# Patient Record
Sex: Male | Born: 1955 | Race: Black or African American | Hispanic: No | Marital: Married | Smoking: Former smoker
Health system: Southern US, Community
[De-identification: ages and names within clinical notes are randomized; demographics above are authoritative.]

## PROBLEM LIST (undated history)

## (undated) DIAGNOSIS — I10 Essential (primary) hypertension: Secondary | ICD-10-CM

## (undated) HISTORY — DX: Essential (primary) hypertension: I10

---

## 1978-09-11 DIAGNOSIS — I10 Essential (primary) hypertension: Secondary | ICD-10-CM | POA: Insufficient documentation

## 2008-07-21 ENCOUNTER — Ambulatory Visit: Payer: Self-pay | Admitting: Family Medicine

## 2008-07-21 ENCOUNTER — Encounter (INDEPENDENT_AMBULATORY_CARE_PROVIDER_SITE_OTHER): Payer: Self-pay | Admitting: Nurse Practitioner

## 2008-07-21 DIAGNOSIS — J309 Allergic rhinitis, unspecified: Secondary | ICD-10-CM | POA: Insufficient documentation

## 2008-07-21 DIAGNOSIS — M25569 Pain in unspecified knee: Secondary | ICD-10-CM

## 2008-08-18 ENCOUNTER — Encounter (INDEPENDENT_AMBULATORY_CARE_PROVIDER_SITE_OTHER): Payer: Self-pay | Admitting: Nurse Practitioner

## 2008-09-14 ENCOUNTER — Ambulatory Visit: Payer: Self-pay | Admitting: Nurse Practitioner

## 2008-09-14 LAB — CONVERTED CEMR LAB
ALT: 29 units/L (ref 0–53)
Bilirubin Urine: NEGATIVE
CO2: 25 meq/L (ref 19–32)
Calcium: 9.3 mg/dL (ref 8.4–10.5)
Chloride: 104 meq/L (ref 96–112)
Cholesterol: 201 mg/dL — ABNORMAL HIGH (ref 0–200)
Eosinophils Relative: 2 % (ref 0–5)
Glucose, Bld: 106 mg/dL — ABNORMAL HIGH (ref 70–99)
HCT: 47.2 % (ref 39.0–52.0)
Hemoglobin: 15.7 g/dL (ref 13.0–17.0)
Ketones, urine, test strip: NEGATIVE
Lymphocytes Relative: 45 % (ref 12–46)
Lymphs Abs: 2.1 10*3/uL (ref 0.7–4.0)
Monocytes Absolute: 0.4 10*3/uL (ref 0.1–1.0)
Neutro Abs: 2 10*3/uL (ref 1.7–7.7)
PSA: 0.65 ng/mL (ref 0.10–4.00)
Platelets: 290 10*3/uL (ref 150–400)
Sodium: 139 meq/L (ref 135–145)
Specific Gravity, Urine: 1.015
Total Bilirubin: 0.6 mg/dL (ref 0.3–1.2)
Total Protein: 7.1 g/dL (ref 6.0–8.3)
Triglycerides: 91 mg/dL (ref <150)
VLDL: 18 mg/dL (ref 0–40)
WBC: 4.7 10*3/uL (ref 4.0–10.5)
pH: 7

## 2008-09-15 ENCOUNTER — Encounter (INDEPENDENT_AMBULATORY_CARE_PROVIDER_SITE_OTHER): Payer: Self-pay | Admitting: Nurse Practitioner

## 2008-09-17 ENCOUNTER — Telehealth (INDEPENDENT_AMBULATORY_CARE_PROVIDER_SITE_OTHER): Payer: Self-pay | Admitting: Nurse Practitioner

## 2008-09-24 ENCOUNTER — Ambulatory Visit: Payer: Self-pay | Admitting: Gastroenterology

## 2008-10-08 ENCOUNTER — Encounter (INDEPENDENT_AMBULATORY_CARE_PROVIDER_SITE_OTHER): Payer: Self-pay | Admitting: Nurse Practitioner

## 2008-10-08 ENCOUNTER — Ambulatory Visit: Payer: Self-pay | Admitting: Gastroenterology

## 2012-08-16 ENCOUNTER — Encounter: Payer: Self-pay | Admitting: Family Medicine

## 2012-08-16 ENCOUNTER — Ambulatory Visit (INDEPENDENT_AMBULATORY_CARE_PROVIDER_SITE_OTHER): Payer: BC Managed Care – PPO | Admitting: Family Medicine

## 2012-08-16 VITALS — BP 150/110 | HR 93 | Temp 98.0°F | Resp 16 | Ht 68.0 in | Wt 191.0 lb

## 2012-08-16 DIAGNOSIS — Z23 Encounter for immunization: Secondary | ICD-10-CM

## 2012-08-16 DIAGNOSIS — I10 Essential (primary) hypertension: Secondary | ICD-10-CM

## 2012-08-16 DIAGNOSIS — Z0000 Encounter for general adult medical examination without abnormal findings: Secondary | ICD-10-CM

## 2012-08-16 DIAGNOSIS — N400 Enlarged prostate without lower urinary tract symptoms: Secondary | ICD-10-CM

## 2012-08-16 DIAGNOSIS — L509 Urticaria, unspecified: Secondary | ICD-10-CM

## 2012-08-16 DIAGNOSIS — Z13220 Encounter for screening for lipoid disorders: Secondary | ICD-10-CM

## 2012-08-16 LAB — COMPREHENSIVE METABOLIC PANEL
Albumin: 4.2 g/dL (ref 3.5–5.2)
Alkaline Phosphatase: 71 U/L (ref 39–117)
Glucose, Bld: 226 mg/dL — ABNORMAL HIGH (ref 70–99)
Potassium: 4.1 mEq/L (ref 3.5–5.3)
Sodium: 137 mEq/L (ref 135–145)
Total Protein: 7.1 g/dL (ref 6.0–8.3)

## 2012-08-16 LAB — LIPID PANEL
LDL Cholesterol: 165 mg/dL — ABNORMAL HIGH (ref 0–99)
Triglycerides: 130 mg/dL (ref <150)

## 2012-08-16 LAB — POCT URINALYSIS DIPSTICK
Ketones, UA: NEGATIVE
Leukocytes, UA: NEGATIVE
Nitrite, UA: NEGATIVE
Protein, UA: 30
Urobilinogen, UA: 0.2
pH, UA: 5.5

## 2012-08-16 LAB — T3, FREE: T3, Free: 3.4 pg/mL (ref 2.3–4.2)

## 2012-08-16 LAB — PSA: PSA: 0.51 ng/mL (ref <=4.00)

## 2012-08-16 MED ORDER — VERAPAMIL HCL 240 MG (CO) PO TB24: 240.0000 mg | ORAL_TABLET | Freq: Every day | ORAL | Status: DC

## 2012-08-16 MED ORDER — HYDROCHLOROTHIAZIDE 25 MG PO TABS: 25.0000 mg | ORAL_TABLET | Freq: Every day | ORAL | Status: DC

## 2012-08-16 NOTE — Progress Notes (Signed)
Subjective:    Patient ID: Jack Brady, male    DOB: July 10, 1956, 56 y.o.   MRN: [id number]  HPI   This 56 y.o. AA male has HTN but has been out of medications for several weeks. He had   CPE last year at [place] facility (15 years of service in the [employer]). He is asymptomatic.   Pt is married and works at [employer]. He is a nonsmoker but does drink 2-3 beers on a   weekly basis. Exercise- walking mostly on the job; upper body strength training consists  of push-ups.    Review of Systems  Constitutional: Negative.   HENT: Negative.   Eyes: Negative.   Respiratory: Negative.   Cardiovascular: Negative.   Gastrointestinal: Negative.   Genitourinary: Negative.        Occasional nocturia  X 1; no symptoms of prostatism  Musculoskeletal: Negative.   Skin: Positive for rash.       Itchy red bumps on inner aspects of arms and around neck. Also has itching of lips and eyelids.  Hematological: Negative.   Psychiatric/Behavioral: Negative.        Objective:   Physical Exam  Nursing note and vitals reviewed. Constitutional: He is oriented to person, place, and time. He appears well-developed and well-nourished. No distress.  HENT:  Head: Normocephalic and atraumatic.  Right Ear: Hearing, tympanic membrane, external ear and ear canal normal.  Left Ear: Hearing, tympanic membrane, external ear and ear canal normal.  Nose: Nose normal. No nasal deformity or septal deviation.  Mouth/Throat: Uvula is midline and mucous membranes are normal. No oral lesions. Normal dentition. No dental caries. Posterior oropharyngeal erythema present. No oropharyngeal exudate.  Eyes: EOM and lids are normal. Pupils are equal, round, and reactive to light. Right conjunctiva is injected. Left conjunctiva is injected. No scleral icterus.       Muddy sclerae  Neck: Normal range of motion. Neck supple. No thyromegaly present.  Cardiovascular: Normal rate, regular rhythm, normal heart sounds and intact  distal pulses.  Exam reveals no gallop and no friction rub.   No murmur heard. Pulmonary/Chest: Effort normal and breath sounds normal. No respiratory distress.  Abdominal: Soft. Normal appearance and normal aorta. He exhibits no distension, no pulsatile midline mass and no mass. There is no hepatosplenomegaly. There is no tenderness. There is no guarding and no CVA tenderness. No hernia.  Genitourinary: Testes normal and penis normal. Rectal exam shows external hemorrhoid. Rectal exam shows no fissure, no mass, no tenderness and anal tone normal. Prostate is enlarged. Prostate is not tender. Cremasteric reflex is present. Right testis shows no mass, no swelling and no tenderness. Right testis is descended. Left testis shows no mass, no swelling and no tenderness. Left testis is descended. Uncircumcised. No penile erythema or penile tenderness. No discharge found.  Musculoskeletal: Normal range of motion. He exhibits no edema and no tenderness.  Lymphadenopathy:    He has no cervical adenopathy.       Right: No inguinal adenopathy present.       Left: No inguinal adenopathy present.  Neurological: He is alert and oriented to person, place, and time. He has normal reflexes. No cranial nerve deficit. He exhibits normal muscle tone. Coordination normal.  Skin: Skin is warm and dry. Rash noted. No erythema.       Red papules on flexor creases of arms (pt reports similar rash occurs around his neck)  Psychiatric: He has a normal mood and affect. His behavior is normal.  Judgment and thought content normal.     ECG: NSR; ST elevation c/w J-point elevation. No ectopy.    Results for orders placed in visit on 08/16/12  POCT URINALYSIS DIPSTICK      Component Value Range   Color, UA yellow     Clarity, UA clear     Glucose, UA 100     Bilirubin, UA neg     Ketones, UA neg     Spec Grav, UA >=1.030     Blood, UA trace     pH, UA 5.5     Protein, UA 30     Urobilinogen, UA 0.2     Nitrite, UA  neg     Leukocytes, UA Negative    IFOBT (OCCULT BLOOD)      Component Value Range   IFOBT Negative          Assessment & Plan:   1. Routine general medical examination at a health care facility  POCT urinalysis dipstick, IFOBT POC (occult bld, rslt in office), PSA  2. HTN, goal below 130/80  Continue current medications Check thyroid function.   3. Screening for hyperlipidemia  Lipid panel  4. Need for prophylactic vaccination with combined diphtheria-tetanus-pertussis (DTP) vaccine  Tdap vaccine greater than or equal to 7yo IM  5. BPH (benign prostatic hypertrophy)  Await PSA result; pt is relatively asymptomatic.  6. Localized hives  Try OTC antihistamine (Claritin, Cetirizine or Allegra)

## 2012-08-16 NOTE — Patient Instructions (Addendum)
Keeping you healthy  Get these tests  Blood pressure- Have your blood pressure checked once a year by your healthcare provider.  Normal blood pressure is 120/80  Weight- Have your body mass index (BMI) calculated to screen for obesity.  BMI is a measure of body fat based on height and weight. You can also calculate your own BMI at [email or web address].  Cholesterol- Have your cholesterol checked every year.  Diabetes- Have your blood sugar checked regularly if you have high blood pressure, high cholesterol, have a family history of diabetes or if you are overweight.  Screening for Colon Cancer- Colonoscopy starting at age 55.  Screening may begin sooner depending on your family history and other health conditions. Follow up colonoscopy as directed by your Gastroenterologist.  Screening for Prostate Cancer- Both blood work (PSA) and a rectal exam help screen for Prostate Cancer.  Screening begins at age 55 with African-American men and at age 18 with Caucasian men.  Screening may begin sooner depending on your family history.  Take these medicines  Aspirin- One aspirin daily can help prevent Heart disease and Stroke. Take a Baby Aspirin (81 mg tablet) everyday.  Flu shot- Every fall.  Tetanus- Every 10 years. You received a Tdap today; this is a one-time vaccine. Your next Tetanus is due in 10 years.  Zostavax- Once after the age of 78 to prevent Shingles.  Pneumonia shot- Once after the age of 55; if you are younger than 84, ask your healthcare provider if you need a Pneumonia shot.  Take these steps  Don't smoke- If you do smoke, talk to your doctor about quitting.  For tips on how to quit, go to www.smokefree.gov or call 1-800-QUIT-NOW.  Be physically active- Exercise 5 days a week for at least 30 minutes.  If you are not already physically active start slow and gradually work up to 30 minutes of moderate physical activity.  Examples of moderate activity include walking briskly,  mowing the yard, dancing, swimming, bicycling, etc.  Eat a healthy diet- Eat a variety of healthy food such as fruits, vegetables, low fat milk, low fat cheese, yogurt, lean meant, poultry, fish, beans, tofu, etc. For more information go to www.thenutritionsource.org  Drink alcohol in moderation- Limit alcohol intake to less than two drinks a day. Never drink and drive.  Dentist- Brush and floss twice daily; visit your dentist twice a year.  Depression- Your emotional health is as important as your physical health. If you're feeling down, or losing interest in things you would normally enjoy please talk to your healthcare provider.  Eye exam- Visit your eye doctor every year.  Safe sex- If you may be exposed to a sexually transmitted infection, use a condom.  Seat belts- Seat belts can save your life; always wear one.  Smoke/Carbon Monoxide detectors- These detectors need to be installed on the appropriate level of your home.  Replace batteries at least once a year.  Skin cancer- When out in the sun, cover up and use sunscreen 15 SPF or higher.  Violence- If anyone is threatening you, please tell your healthcare provider.  Living Will/ Health care power of attorney- Speak with your healthcare provider and family.    Hypertension As your heart beats, it forces blood through your arteries. This force is your blood pressure. If the pressure is too high, it is called hypertension (HTN) or high blood pressure. HTN is dangerous because you may have it and not know it. High blood pressure may mean that  your heart has to work harder to pump blood. Your arteries may be narrow or stiff. The extra work puts you at risk for heart disease, stroke, and other problems.  Blood pressure consists of two numbers, a higher number over a lower, 110/72, for example. It is stated as "110 over 72." The ideal is below 120 for the top number (systolic) and under 80 for the bottom (diastolic). Write down your blood  pressure today. You should pay close attention to your blood pressure if you have certain conditions such as:  Heart failure.  Prior heart attack.  Diabetes  Chronic kidney disease.  Prior stroke.  Multiple risk factors for heart disease. To see if you have HTN, your blood pressure should be measured while you are seated with your arm held at the level of the heart. It should be measured at least twice. A one-time elevated blood pressure reading (especially in the Emergency Department) does not mean that you need treatment. There may be conditions in which the blood pressure is different between your right and left arms. It is important to see your caregiver soon for a recheck. Most people have essential hypertension which means that there is not a specific cause. This type of high blood pressure may be lowered by changing lifestyle factors such as:  Stress.  Smoking.  Lack of exercise.  Excessive weight.  Drug/tobacco/alcohol use.  Eating less salt. Most people do not have symptoms from high blood pressure until it has caused damage to the body. Effective treatment can often prevent, delay or reduce that damage. TREATMENT  When a cause has been identified, treatment for high blood pressure is directed at the cause. There are a large number of medications to treat HTN. These fall into several categories, and your caregiver will help you select the medicines that are best for you. Medications may have side effects. You should review side effects with your caregiver. If your blood pressure stays high after you have made lifestyle changes or started on medicines,   Your medication(s) may need to be changed.  Other problems may need to be addressed.  Be certain you understand your prescriptions, and know how and when to take your medicine.  Be sure to follow up with your caregiver within the time frame advised (usually within two weeks) to have your blood pressure rechecked and to  review your medications.  If you are taking more than one medicine to lower your blood pressure, make sure you know how and at what times they should be taken. Taking two medicines at the same time can result in blood pressure that is too low. SEEK IMMEDIATE MEDICAL CARE IF:  You develop a severe headache, blurred or changing vision, or confusion.  You have unusual weakness or numbness, or a faint feeling.  You have severe chest or abdominal pain, vomiting, or breathing problems. MAKE SURE YOU:   Understand these instructions.  Will watch your condition.  Will get help right away if you are not doing well or get worse. Document Released: 08/28/2005 Document Revised: 11/20/2011 Document Reviewed: 04/17/2008 [facility] Patient Information 2013 [place], [place].   Hives Hives (urticaria) are itchy, red, swollen patches on the skin. They may change size, shape, and location quickly and repeatedly. Hives that occur deeper in the skin can cause swelling of the hands, feet, and face. Hives may be an allergic reaction to something you ate, touched, or put on your skin. Hives can also be a reaction to cold, heat, viral  infections, medication, insect bites, or emotional stress. Often the cause is hard to find. Hives can come and go for several days to several weeks. Hives are not contagious. HOME CARE INSTRUCTIONS   If the cause of the hives is known, avoid exposure to that source.   To relieve itching and rash:   Apply cold compresses to the skin or take cool water baths. Do not take hot baths or showers because the warmth will make the itching worse.   The best medicine for hives is an antihistamine. An antihistamine will not cure hives, but it will reduce their severity. You can use an antihistamine available over the counter. This medicine may make you sleepy. You should not drive while using this medicine.   Take or give an antihistamine as directed by your pharmacist or caregiver. You can  get over-the-counter Claritin, Cetirizine (Zyrtec) or Fexofenadine (Allegra); take 1 tablet every evening.  Other medications may be prescribed for itching. Use these as directed.   You should wear loose fitting clothing, including undergarments, as skin irritations may make hives worse.   Follow-up as directed by your caregiver.  SEEK MEDICAL CARE IF:   You still have considerable itching after taking the medication (prescribed or purchased over the counter).   Joint swelling or pain occurs.   You are not improving or are getting worse.  SEEK IMMEDIATE MEDICAL CARE IF:   You have a fever.   You notice any swelling of the lips, tongue, face or neck.   You have shortness of breath, difficulty breathing or swallowing, or tightness in the throat or chest.   Belly (abdominal) pain develops.   You are feeling very sick.   Your hives continue to spread.  These may be the first signs of a life-threatening allergic reaction. THIS IS AN EMERGENCY. Call 911 for medical help. Document Released: 10/05/2004 Document Revised: 08/17/2011 Document Reviewed: 09/22/2008 [facility] Patient Information 2012 [place], [place].

## 2012-08-20 ENCOUNTER — Encounter: Payer: Self-pay | Admitting: Family Medicine

## 2012-08-20 DIAGNOSIS — N400 Enlarged prostate without lower urinary tract symptoms: Secondary | ICD-10-CM | POA: Insufficient documentation

## 2012-08-20 NOTE — Progress Notes (Signed)
Quick Note:  Please call pt and advise that the following labs are abnormal... Your blood sugar is elevated as well as cholesterol, especially LDL ("bad") cholesterol. Your risk of heart disease based on your lipid numbers is average. Work on better nutrition and regular exercsie. The "Mediterranean Diet" is a great guide for healthier eating. When you return in January, we will recheck your blood sugar and do a test to see if you have Diabetes.  Prostate blood test and thyroid tests are normal. Kidney and liver tests are normal.  Copy to pt. ______

## 2012-08-26 ENCOUNTER — Encounter: Payer: Self-pay | Admitting: Radiology

## 2012-09-27 ENCOUNTER — Ambulatory Visit: Payer: BC Managed Care – PPO | Admitting: Family Medicine

## 2012-10-29 ENCOUNTER — Encounter: Payer: Self-pay | Admitting: Family Medicine

## 2013-10-29 ENCOUNTER — Ambulatory Visit (INDEPENDENT_AMBULATORY_CARE_PROVIDER_SITE_OTHER): Payer: BC Managed Care – PPO | Admitting: Family Medicine

## 2013-10-29 ENCOUNTER — Encounter: Payer: Self-pay | Admitting: Family Medicine

## 2013-10-29 VITALS — BP 142/102 | HR 87 | Temp 98.2°F | Resp 16 | Ht 68.0 in | Wt 181.0 lb

## 2013-10-29 DIAGNOSIS — Z8639 Personal history of other endocrine, nutritional and metabolic disease: Secondary | ICD-10-CM

## 2013-10-29 DIAGNOSIS — Z0000 Encounter for general adult medical examination without abnormal findings: Secondary | ICD-10-CM

## 2013-10-29 DIAGNOSIS — I10 Essential (primary) hypertension: Secondary | ICD-10-CM

## 2013-10-29 DIAGNOSIS — E119 Type 2 diabetes mellitus without complications: Secondary | ICD-10-CM | POA: Insufficient documentation

## 2013-10-29 DIAGNOSIS — Z125 Encounter for screening for malignant neoplasm of prostate: Secondary | ICD-10-CM

## 2013-10-29 DIAGNOSIS — Z862 Personal history of diseases of the blood and blood-forming organs and certain disorders involving the immune mechanism: Secondary | ICD-10-CM

## 2013-10-29 DIAGNOSIS — R350 Frequency of micturition: Secondary | ICD-10-CM

## 2013-10-29 DIAGNOSIS — IMO0001 Reserved for inherently not codable concepts without codable children: Secondary | ICD-10-CM

## 2013-10-29 DIAGNOSIS — J309 Allergic rhinitis, unspecified: Secondary | ICD-10-CM

## 2013-10-29 DIAGNOSIS — E1165 Type 2 diabetes mellitus with hyperglycemia: Secondary | ICD-10-CM

## 2013-10-29 LAB — POCT URINALYSIS DIPSTICK
BILIRUBIN UA: NEGATIVE
Blood, UA: NEGATIVE
Glucose, UA: 500
KETONES UA: NEGATIVE
Leukocytes, UA: NEGATIVE
Nitrite, UA: NEGATIVE
PH UA: 6
Protein, UA: 100
Spec Grav, UA: 1.025
Urobilinogen, UA: 0.2

## 2013-10-29 LAB — COMPLETE METABOLIC PANEL WITH GFR
ALT: 21 U/L (ref 0–53)
AST: 18 U/L (ref 0–37)
Albumin: 4 g/dL (ref 3.5–5.2)
Alkaline Phosphatase: 72 U/L (ref 39–117)
BILIRUBIN TOTAL: 0.6 mg/dL (ref 0.2–1.2)
BUN: 13 mg/dL (ref 6–23)
CHLORIDE: 97 meq/L (ref 96–112)
CO2: 28 mEq/L (ref 19–32)
Calcium: 9.4 mg/dL (ref 8.4–10.5)
Creat: 0.95 mg/dL (ref 0.50–1.35)
GFR, EST NON AFRICAN AMERICAN: 89 mL/min
GLUCOSE: 287 mg/dL — AB (ref 70–99)
Potassium: 4.3 mEq/L (ref 3.5–5.3)
SODIUM: 134 meq/L — AB (ref 135–145)
Total Protein: 7.1 g/dL (ref 6.0–8.3)

## 2013-10-29 LAB — POCT GLYCOSYLATED HEMOGLOBIN (HGB A1C): HEMOGLOBIN A1C: 14

## 2013-10-29 LAB — LIPID PANEL
Cholesterol: 245 mg/dL — ABNORMAL HIGH (ref 0–200)
HDL: 56 mg/dL (ref >39)
LDL CALC: 161 mg/dL — AB (ref 0–99)
TRIGLYCERIDES: 138 mg/dL (ref <150)
Total CHOL/HDL Ratio: 4.4 Ratio
VLDL: 28 mg/dL (ref 0–40)

## 2013-10-29 LAB — IFOBT (OCCULT BLOOD): IMMUNOLOGICAL FECAL OCCULT BLOOD TEST: NEGATIVE

## 2013-10-29 MED ORDER — VERAPAMIL HCL 240 MG (CO) PO TB24: 240.0000 mg | ORAL_TABLET | Freq: Every day | ORAL | Status: DC

## 2013-10-29 MED ORDER — METFORMIN HCL 1000 MG PO TABS: ORAL_TABLET | ORAL | Status: DC

## 2013-10-29 MED ORDER — GLUCOSE BLOOD VI STRP: ORAL_STRIP | Status: DC

## 2013-10-29 MED ORDER — BLOOD PRESSURE MONITOR/M CUFF MISC: 1.0000 | Freq: Every day | Status: DC

## 2013-10-29 MED ORDER — HYDROCHLOROTHIAZIDE 25 MG PO TABS: 25.0000 mg | ORAL_TABLET | Freq: Every day | ORAL | Status: DC

## 2013-10-29 MED ORDER — DESLORATADINE 5 MG PO TBDP: ORAL_TABLET | ORAL | Status: DC

## 2013-10-29 NOTE — Patient Instructions (Addendum)
ALLERGIES- I have prescribed a medication (Clarinex-generic); take 1 tablet daily, Get an over-the-counter saline nasal mist to help reduce congestion and allergy symptoms.   Keeping you healthy  Get these tests  Blood pressure- Have your blood pressure checked once a year by your healthcare provider.  Normal blood pressure is 120/80  Weight- Have your body mass index (BMI) calculated to screen for obesity.  BMI is a measure of body fat based on height and weight. You can also calculate your own BMI at [email or web address].  Cholesterol- Have your cholesterol checked every year.  Diabetes- Have your blood sugar checked regularly if you have high blood pressure, high cholesterol, have a family history of diabetes or if you are overweight.  Screening for Colon Cancer- Colonoscopy starting at age 35.  Screening may begin sooner depending on your family history and other health conditions. Follow up colonoscopy as directed by your Gastroenterologist.  Screening for Prostate Cancer- Both blood work (PSA) and a rectal exam help screen for Prostate Cancer.  Screening begins at age 67 with African-American men and at age 61 with Caucasian men.  Screening may begin sooner depending on your family history.  Take these medicines  Aspirin- One aspirin daily can help prevent Heart disease and Stroke.  Flu shot- Every fall.  Tetanus- Every 10 years. You received a Tdap in Dec 2013; next Tetanus due in 2023.  Zostavax- Once after the age of 70 to prevent Shingles.  Pneumonia shot- Once after the age of 38; if you are younger than 54, ask your healthcare provider if you need a Pneumonia shot.  Take these steps  Don't smoke- If you do smoke, talk to your doctor about quitting.  For tips on how to quit, go to www.smokefree.gov or call 1-800-QUIT-NOW.  Be physically active- Exercise 5 days a week for at least 30 minutes.  If you are not already physically active start slow and gradually work up  to 30 minutes of moderate physical activity.  Examples of moderate activity include walking briskly, mowing the yard, dancing, swimming, bicycling, etc.  Eat a healthy diet- Eat a variety of healthy food such as fruits, vegetables, low fat milk, low fat cheese, yogurt, lean meant, poultry, fish, beans, tofu, etc. For more information go to www.thenutritionsource.org  Drink alcohol in moderation- Limit alcohol intake to less than two drinks a day. Never drink and drive.  Dentist- Brush and floss twice daily; visit your dentist twice a year.  Depression- Your emotional health is as important as your physical health. If you're feeling down, or losing interest in things you would normally enjoy please talk to your healthcare provider.  Eye exam- Visit your eye doctor every year.  Safe sex- If you may be exposed to a sexually transmitted infection, use a condom.  Seat belts- Seat belts can save your life; always wear one.  Smoke/Carbon Monoxide detectors- These detectors need to be installed on the appropriate level of your home.  Replace batteries at least once a year.  Skin cancer- When out in the sun, cover up and use sunscreen 15 SPF or higher.  Violence- If anyone is threatening you, please tell your healthcare provider.  Living Will/ Health care power of attorney- Speak with your healthcare provider and family.   Diabetes and Standards of Medical Care  Diabetes is complicated. You may find that your diabetes team includes a dietitian, nurse, diabetes educator, eye doctor, and more. To help everyone know what is going on and to help you  get the care you deserve, the following schedule of care was developed to help keep you on track. Below are the tests, exams, vaccines, medicines, education, and plans you will need. HbA1c test This test shows how well you have controlled your glucose over the past 2 3 months. It is used to see if your diabetes management plan needs to be adjusted.   It  is performed at least 2 times a year if you are meeting treatment goals.  It is performed 4 times a year if therapy has changed or if you are not meeting treatment goals. Blood pressure test  This test is performed at every routine medical visit. The goal is less than 140/90 mmHg for most people, but 130/80 mmHg in some cases. Ask your health care provider about your goal. Dental exam  Follow up with the dentist regularly. Eye exam  If you are diagnosed with type 1 diabetes as a child, get an exam upon reaching the age of 3 years or older and have had diabetes for 3 5 years. Yearly eye exams are recommended after that initial eye exam.  If you are diagnosed with type 1 diabetes as an adult, get an exam within 5 years of diagnosis and then yearly.  If you are diagnosed with type 2 diabetes, get an exam as soon as possible after the diagnosis and then yearly. Foot care exam  Visual foot exams are performed at every routine medical visit. The exams check for cuts, injuries, or other problems with the feet.  A comprehensive foot exam should be done yearly. This includes visual inspection as well as assessing foot pulses and testing for loss of sensation.  Check your feet nightly for cuts, injuries, or other problems with your feet. Tell your health care provider if anything is not healing. Kidney function test (urine microalbumin)  This test is performed once a year.  Type 1 diabetes: The first test is performed 5 years after diagnosis.  Type 2 diabetes: The first test is performed at the time of diagnosis.  A serum creatinine and estimated glomerular filtration rate (eGFR) test is done once a year to assess the level of chronic kidney disease (CKD), if present. Lipid profile (cholesterol, HDL, LDL, triglycerides)  Performed every 5 years for most people.  The goal for LDL is less than 100 mg/dL. If you are at high risk, the goal is less than 70 mg/dL.  The goal for HDL is 40 mg/dL  50 mg/dL for men and 50 mg/dL 60 mg/dL for women. An HDL cholesterol of 60 mg/dL or higher gives some protection against heart disease.  The goal for triglycerides is less than 150 mg/dL. Influenza vaccine, pneumococcal vaccine, and hepatitis B vaccine  The influenza vaccine is recommended yearly.  The pneumococcal vaccine is generally given once in a lifetime. However, there are some instances when another vaccination is recommended. Check with your health care provider.  The hepatitis B vaccine is also recommended for adults with diabetes. Diabetes self-management education  Education is recommended at diagnosis and ongoing as needed. Treatment plan  Your treatment plan is reviewed at every medical visit. Document Released: 06/25/2009 Document Revised: 04/30/2013 Document Reviewed: 01/28/2013 [facility] Patient Information 2014 [place].   Diabetes Meal Planning Guide The diabetes meal planning guide is a tool to help you plan your meals and snacks. It is important for people with diabetes to manage their blood glucose (sugar) levels. Choosing the right foods and the right amounts throughout your day  will help control your blood glucose. Eating right can even help you improve your blood pressure and reach or maintain a healthy weight. CARBOHYDRATE COUNTING MADE EASY When you eat carbohydrates, they turn to sugar. This raises your blood glucose level. Counting carbohydrates can help you control this level so you feel better. When you plan your meals by counting carbohydrates, you can have more flexibility in what you eat and balance your medicine with your food intake. Carbohydrate counting simply means adding up the total amount of carbohydrate grams in your meals and snacks. Try to eat about the same amount at each meal. Foods with carbohydrates are listed below. Each portion below is 1 carbohydrate serving or 15 grams of carbohydrates. Ask your dietician how many grams of  carbohydrates you should eat at each meal or snack. Grains and Starches  1 slice bread.   English muffin or hotdog/hamburger bun.   cup cold cereal (unsweetened).   cup cooked pasta or rice.   cup starchy vegetables (corn, potatoes, peas, beans, winter squash).  1 tortilla (6 inches).   bagel.  1 waffle or pancake (size of a CD).   cup cooked cereal.  4 to 6 small crackers. *Whole grain is recommended. Fruit  1 cup fresh unsweetened berries, melon, papaya, pineapple.  1 small fresh fruit.   banana or mango.   cup fruit juice (4 oz unsweetened).   cup canned fruit in natural juice or water.  2 tbs dried fruit.  12 to 15 grapes or cherries. Milk and Yogurt  1 cup fat-free or 1% milk.  1 cup soy milk.  6 oz light yogurt with sugar-free sweetener.  6 oz low-fat soy yogurt.  6 oz plain yogurt. Vegetables  1 cup raw or  cup cooked is counted as 0 carbohydrates or a "free" food.  If you eat 3 or more servings at 1 meal, count them as 1 carbohydrate serving. Other Carbohydrates   oz chips or pretzels.   cup ice cream or frozen yogurt.   cup sherbet or sorbet.  2 inch square cake, no frosting.  1 tbs honey, sugar, jam, jelly, or syrup.  2 small cookies.  3 squares of graham crackers.  3 cups popcorn.  6 crackers.  1 cup broth-based soup.  Count 1 cup casserole or other mixed foods as 2 carbohydrate servings.  Foods with less than 20 calories in a serving may be counted as 0 carbohydrates or a "free" food. You may want to purchase a book or computer software that lists the carbohydrate gram counts of different foods. In addition, the nutrition facts panel on the labels of the foods you eat are a good source of this information. The label will tell you how big the serving size is and the total number of carbohydrate grams you will be eating per serving. Divide this number by 15 to obtain the number of carbohydrate servings in a portion.  Remember, 1 carbohydrate serving equals 15 grams of carbohydrate. SERVING SIZES Measuring foods and serving sizes helps you make sure you are getting the right amount of food. The list below tells how big or small some common serving sizes are.  1 oz.........4 stacked dice.  3 oz........Marland KitchenDeck of cards.  1 tsp.......Marland KitchenTip of little finger.  1 tbs......Marland KitchenMarland KitchenThumb.  2 tbs.......Marland KitchenGolf ball.   cup......Marland KitchenHalf of a fist.  1 cup.......Marland KitchenA fist. SAMPLE DIABETES MEAL PLAN Below is a sample meal plan that includes foods from the grain and starches, dairy, vegetable, fruit, and meat groups.  A dietician can individualize a meal plan to fit your calorie needs and tell you the number of servings needed from each food group. However, controlling the total amount of carbohydrates in your meal or snack is more important than making sure you include all of the food groups at every meal. You may interchange carbohydrate containing foods (dairy, starches, and fruits). The meal plan below is an example of a 2000 calorie diet using carbohydrate counting. This meal plan has 17 carbohydrate servings. Breakfast  1 cup oatmeal (2 carb servings).   cup light yogurt (1 carb serving).  1 cup blueberries (1 carb serving).   cup almonds. Snack  1 large apple (2 carb servings).  1 low-fat string cheese stick. Lunch  Chicken breast salad.  1 cup spinach.   cup chopped tomatoes.  2 oz chicken breast, sliced.  2 tbs low-fat [place] dressing.  12 whole-wheat crackers (2 carb servings).  12 to 15 grapes (1 carb serving).  1 cup low-fat milk (1 carb serving). Snack  1 cup carrots.   cup hummus (1 carb serving). Dinner  3 oz broiled salmon.  1 cup brown rice (3 carb servings). Snack  1  cups steamed broccoli (1 carb serving) drizzled with 1 tsp olive oil and lemon juice.  1 cup light pudding (2 carb servings). DIABETES MEAL PLANNING WORKSHEET Your dietician can use this worksheet to help  you decide how many servings of foods and what types of foods are right for you.  BREAKFAST Food Group and Servings / Carb Servings Grain/Starches __________________________________ Dairy __________________________________________ Vegetable ______________________________________ Fruit ___________________________________________ Meat __________________________________________ Fat ____________________________________________ LUNCH Food Group and Servings / Carb Servings Grain/Starches ___________________________________ Dairy ___________________________________________ Fruit ____________________________________________ Meat ___________________________________________ Fat _____________________________________________ [employer] Food Group and Servings / Carb Servings Grain/Starches ___________________________________ Dairy ___________________________________________ Fruit ____________________________________________ Meat ___________________________________________ Fat _____________________________________________ SNACKS Food Group and Servings / Carb Servings Grain/Starches ___________________________________ Dairy ___________________________________________ Vegetable _______________________________________ Fruit ____________________________________________ Meat ___________________________________________ Fat _____________________________________________ DAILY TOTALS Starches _________________________ Vegetable ________________________ Fruit ____________________________ Dairy ____________________________ Meat ____________________________ Fat ______________________________ Document Released: 05/25/2005 Document Revised: 11/20/2011 Document Reviewed: 04/05/2009 [employer] Patient Information 2014 [employer].

## 2013-10-29 NOTE — Progress Notes (Signed)
Subjective:    Patient ID: Jack StampsRaymond Crall, male    DOB: [birth year], 57 y.o.   MRN: [id number]  HPI  This 58 y.o. AA male has HTN; out of his meds for months. He c/o urinary frequency and frothy urine. Diabetes runs in his family; his brother died as a result of diabetic coma. He has scheduled a vision exam.  Patient Active Problem List   Diagnosis Date Noted  . BPH (benign prostatic hypertrophy) 08/20/2012  . ALLERGIC RHINITIS 07/21/2008  . KNEE PAIN, RIGHT 07/21/2008  . HYPERTENSION, BENIGN ESSENTIAL 09/11/1978    Prior to Admission medications   Medication Sig Start Date End Date Taking? Authorizing Provider  aspirin 81 MG tablet Take 81 mg by mouth daily.   Yes Historical Provider, MD  fish oil-omega-3 fatty acids 1000 MG capsule Take 2 g by mouth daily.   Yes Historical Provider, MD  hydrochlorothiazide (HYDRODIURIL) 25 MG tablet Take 1 tablet (25 mg total) by mouth daily. 10/29/13  Yes Maurice MarchBarbara B Florrie Ramires, MD  OVER THE COUNTER MEDICATION Vitamin D 400 iu daily   Yes Historical Provider, MD  verapamil (COVERA HS) 240 MG (CO) 24 hr tablet Take 1 tablet (240 mg total) by mouth at bedtime. 10/29/13  Yes Maurice MarchBarbara B Adreana Coull, MD  desloratadine (CLARINEX REDITAB) 5 MG disintegrating tablet Take 1 tablet by mouth once a day. 10/29/13   Maurice MarchBarbara B Donni Oglesby, MD   PMHx, Surg Hx, Soc and Fam Hx reviewed.  Review of Systems  Constitutional: Positive for unexpected weight change.       Weight loss= 10 lbs.  HENT: Positive for congestion, ear pain, postnasal drip and sinus pressure. Negative for sore throat and trouble swallowing.   Eyes: Positive for redness and itching.  Respiratory: Negative.   Cardiovascular: Negative.   Gastrointestinal: Negative.   Endocrine: Positive for polyuria.  Genitourinary: Positive for discharge and penile swelling.       Pt is monogamous (married); not currently having sex.  Musculoskeletal: Negative.   Skin: Negative.   Allergic/Immunologic: Positive for  environmental allergies.  Neurological: Negative.   Hematological: Negative.   Psychiatric/Behavioral: Negative.       Objective:   Physical Exam  Nursing note and vitals reviewed. Constitutional: He is oriented to person, place, and time. He appears well-developed and well-nourished. No distress.  HENT:  Head: Normocephalic and atraumatic.  Right Ear: Hearing, external ear and ear canal normal. Tympanic membrane is injected and erythematous. Tympanic membrane is not perforated and not bulging. No middle ear effusion. No decreased hearing is noted.  Left Ear: Tympanic membrane, external ear and ear canal normal. No decreased hearing is noted.  Nose: Mucosal edema present. No rhinorrhea, nasal deformity or septal deviation.  Mouth/Throat: Uvula is midline and mucous membranes are normal. No oral lesions. Abnormal dentition. Dental caries present. Posterior oropharyngeal erythema present. No oropharyngeal exudate.  Eyes: EOM and lids are normal. Pupils are equal, round, and reactive to light. Right conjunctiva is injected. Left conjunctiva is injected. No scleral icterus.  Fundoscopic exam:      The right eye shows arteriolar narrowing. The right eye shows no papilledema. The right eye shows red reflex.       The left eye shows arteriolar narrowing. The left eye shows no papilledema. The left eye shows red reflex.  Neck: Trachea normal, normal range of motion and full passive range of motion without pain. Neck supple. No JVD present. No spinous process tenderness and no muscular tenderness present. No mass and no  thyromegaly present.  Cardiovascular: Normal rate, regular rhythm, S1 normal, S2 normal, normal heart sounds and normal pulses.   No extrasystoles are present. PMI is not displaced.  Exam reveals no gallop and no friction rub.   No murmur heard. Pulmonary/Chest: Effort normal and breath sounds normal. No respiratory distress.  Abdominal: Soft. Normal appearance and bowel sounds are  normal. He exhibits no distension, no abdominal bruit, no pulsatile midline mass and no mass. There is no hepatosplenomegaly. There is no tenderness. There is no guarding and no CVA tenderness. No hernia.  Genitourinary: Rectal exam shows external hemorrhoid. Rectal exam shows no fissure, no mass, no tenderness and anal tone normal. Guaiac negative stool. Prostate is enlarged. Prostate is not tender.  Musculoskeletal: Normal range of motion. He exhibits no edema and no tenderness.  Lymphadenopathy:       Head (right side): No submental, no submandibular, no tonsillar, no preauricular, no posterior auricular and no occipital adenopathy present.       Head (left side): No submental, no submandibular, no tonsillar, no preauricular, no posterior auricular and no occipital adenopathy present.    He has no cervical adenopathy.       Right: No inguinal and no supraclavicular adenopathy present.       Left: No inguinal and no supraclavicular adenopathy present.  Neurological: He is alert and oriented to person, place, and time. He has normal strength and normal reflexes. He displays no atrophy and no tremor. No cranial nerve deficit or sensory deficit. He exhibits normal muscle tone. Coordination and gait normal.  Skin: Skin is warm, dry and intact. No ecchymosis, no lesion and no rash noted. He is not diaphoretic. No cyanosis or erythema. Nails show no clubbing.  Scalp- R temporal area (above ear)- 1 cm hyperpigmented flat nevus.  Psychiatric: He has a normal mood and affect. His speech is normal and behavior is normal. Judgment and thought content normal. Cognition and memory are normal.    Results for orders placed in visit on 10/29/13  POCT URINALYSIS DIPSTICK      Result Value Ref Range   Color, UA yellow     Clarity, UA clear     Glucose, UA 500     Bilirubin, UA neg     Ketones, UA neg     Spec Grav, UA 1.025     Blood, UA neg     pH, UA 6.0     Protein, UA 100     Urobilinogen, UA 0.2      Nitrite, UA neg     Leukocytes, UA Negative    POCT GLYCOSYLATED HEMOGLOBIN (HGB A1C)      Result Value Ref Range   Hemoglobin A1C 14.0    IFOBT (OCCULT BLOOD)      Result Value Ref Range   IFOBT Negative        Assessment & Plan:  Routine general medical examination at a health care facility  HYPERTENSION, BENIGN ESSENTIAL -Medication compliance emphasized. Continue same medications.     Plan: Lipid panel, COMPLETE METABOLIC PANEL WITH GFR  Hx of hyperglycemia - Dates back to 2011.  Plan: POCT glycosylated hemoglobin (Hb A1C)  Type II or unspecified type diabetes mellitus without mention of complication, uncontrolled - Metformin 1000 mg 1 tablet once daily for 2 weeks then increase to 1 tab bid w/ meals. Discussed common side effects. Pt apprehensive about Insulin; advised him that Insulin will be prescribed if Diabetic control not achieved on oral medication. Plan: For home  use only DME Glucometer; FSBS twice daily.  Will need urine micro-albumin at follow-up DM visit.  Urinary frequency - Due to glucosuria.   Plan: POCT urinalysis dipstick, POCT glycosylated hemoglobin (Hb A1C)  Allergic rhinitis- Clarinex prescribed; get OTC saline nasal spray- AYR- and use per package directions.  Screening for prostate cancer - Plan: IFOBT POC (occult bld, rslt in office), PSA   Meds ordered this encounter  Medications  . DISCONTD: hydrochlorothiazide (HYDRODIURIL) 25 MG tablet    Sig: Take 1 tablet (25 mg total) by mouth daily.    Dispense:  30 tablet    Refill:  5  . DISCONTD: verapamil (COVERA HS) 240 MG (CO) 24 hr tablet    Sig: Take 1 tablet (240 mg total) by mouth at bedtime.    Dispense:  30 tablet    Refill:  5  . hydrochlorothiazide (HYDRODIURIL) 25 MG tablet    Sig: Take 1 tablet (25 mg total) by mouth daily.    Dispense:  30 tablet    Refill:  5  . verapamil (COVERA HS) 240 MG (CO) 24 hr tablet    Sig: Take 1 tablet (240 mg total) by mouth at bedtime.    Dispense:  30  tablet    Refill:  5  . desloratadine (CLARINEX REDITAB) 5 MG disintegrating tablet    Sig: Take 1 tablet by mouth once a day.    Dispense:  30 tablet    Refill:  5  . Blood Pressure Monitoring (BLOOD PRESSURE MONITOR/M CUFF) MISC    Sig: 1 Device by Does not apply route daily.    Dispense:  1 each    Refill:  0    Wrist cuff- medium  . metFORMIN (GLUCOPHAGE) 1000 MG tablet    Sig: Take 1 tablet daily with largest meal for 2 weeks; increase dose to 1 tablet twice a day with meals.    Dispense:  60 tablet    Refill:  3  . glucose blood test strip    Sig: Use as instructed; test blood sugar twice a day.    Dispense:  60 each    Refill:  12

## 2013-10-30 LAB — PSA: PSA: 0.55 ng/mL (ref <=4.00)

## 2013-10-30 LAB — GC/CHLAMYDIA PROBE AMP
CT Probe RNA: NEGATIVE
GC Probe RNA: NEGATIVE

## 2013-10-31 NOTE — Progress Notes (Signed)
Quick Note:  Please advise pt regarding following labs... Total cholesterol and LDL ("bad") cholesterol are above normal. This is consistent with Diabetes; these numbers should improve once Diabetes is better controlled.  All other labs results are normal. Keep next appointment in April for Diabetes follow-up.  Copy to pt. ______

## 2013-12-10 ENCOUNTER — Encounter: Payer: Self-pay | Admitting: Family Medicine

## 2013-12-10 ENCOUNTER — Ambulatory Visit (INDEPENDENT_AMBULATORY_CARE_PROVIDER_SITE_OTHER): Payer: BC Managed Care – PPO | Admitting: Family Medicine

## 2013-12-10 VITALS — BP 137/88 | HR 73 | Temp 98.4°F | Resp 16 | Ht 68.5 in | Wt 184.0 lb

## 2013-12-10 DIAGNOSIS — E1165 Type 2 diabetes mellitus with hyperglycemia: Principal | ICD-10-CM

## 2013-12-10 DIAGNOSIS — IMO0001 Reserved for inherently not codable concepts without codable children: Secondary | ICD-10-CM

## 2013-12-10 LAB — POCT GLYCOSYLATED HEMOGLOBIN (HGB A1C): Hemoglobin A1C: 10.4

## 2013-12-10 NOTE — Progress Notes (Signed)
S:  This 58 y.o. AA male has Type II DM, uncontrolled. FSBS 2x daily; this morning was 127. Denies hypoglycemic episodes. Compliant w/ medications. No GI side effects w/ Metformin. No reports of fatigue, diaphoresis, abnormal weight change, vision disturbance, CP or tightness, edema, HA, dizziness, weakness or syncope.  Vision exam scheduled for December 15, 2013.  Patient Active Problem List   Diagnosis Date Noted  . Type II or unspecified type diabetes mellitus without mention of complication, uncontrolled 10/29/2013  . BPH (benign prostatic hypertrophy) 08/20/2012  . ALLERGIC RHINITIS 07/21/2008  . KNEE PAIN, RIGHT 07/21/2008  . HYPERTENSION, BENIGN ESSENTIAL 09/11/1978   Prior to Admission medications   Medication Sig Start Date End Date Taking? Authorizing Provider  aspirin 81 MG tablet Take 81 mg by mouth daily.   Yes Historical Provider, MD  desloratadine (CLARINEX REDITAB) 5 MG disintegrating tablet Take 1 tablet by mouth once a day. 10/29/13  Yes Maurice MarchBarbara B Hulen Mandler, MD  fish oil-omega-3 fatty acids 1000 MG capsule Take 2 g by mouth daily.   Yes Historical Provider, MD  glucose blood test strip Use as instructed; test blood sugar twice a day. 10/29/13  Yes Maurice MarchBarbara B Keela Rubert, MD  hydrochlorothiazide (HYDRODIURIL) 25 MG tablet Take 1 tablet (25 mg total) by mouth daily. 10/29/13  Yes Maurice MarchBarbara B Yoshiharu Brassell, MD  metFORMIN (GLUCOPHAGE) 1000 MG tablet Take 1 tablet daily with largest meal for 2 weeks; increase dose to 1 tablet twice a day with meals. 10/29/13  Yes Maurice MarchBarbara B Malcomb Gangemi, MD  OVER THE COUNTER MEDICATION Vitamin D 400 iu daily   Yes Historical Provider, MD  verapamil (COVERA HS) 240 MG (CO) 24 hr tablet Take 1 tablet (240 mg total) by mouth at bedtime. 10/29/13  Yes Maurice MarchBarbara B Kiylee Thoreson, MD   PMHx, Surg, Hx, Soc and Fam Hx reviewed.  O: Filed Vitals:   12/10/13 1002  BP: 137/88  Pulse: 73  Temp: 98.4 F (36.9 C)  Resp: 16   GEN: In NAD; WN,WD. HENT: [place]/AT. EOMI w/ clear  conj/sclerae. Wears corrective lenses. Otherwise unremarkable. COR: RRR. LUNGS: Unlabored resp/ SKIN: W&D; Intact w/o diaphoresis, erythema or lesions. NEURO: A&O x 3; CNs intact. Nonfocal.  Results for orders placed in visit on 12/10/13  POCT GLYCOSYLATED HEMOGLOBIN (HGB A1C)      Result Value Ref Range   Hemoglobin A1C 10.4     A/P: Type II or unspecified type diabetes mellitus without mention of complication, uncontrolled - Continue current medications, meal planning and physical activity. Plan: HM Diabetes Foot Exam, POCT glycosylated hemoglobin (Hb A1C)  RTC in 3 months.

## 2014-03-11 ENCOUNTER — Ambulatory Visit: Payer: BC Managed Care – PPO | Admitting: Family Medicine

## 2014-05-05 ENCOUNTER — Encounter: Payer: Self-pay | Admitting: Family Medicine

## 2014-05-05 ENCOUNTER — Ambulatory Visit (INDEPENDENT_AMBULATORY_CARE_PROVIDER_SITE_OTHER): Payer: BC Managed Care – PPO | Admitting: Family Medicine

## 2014-05-05 ENCOUNTER — Ambulatory Visit (INDEPENDENT_AMBULATORY_CARE_PROVIDER_SITE_OTHER): Payer: BC Managed Care – PPO

## 2014-05-05 VITALS — BP 142/96 | HR 80 | Temp 98.1°F | Resp 16 | Ht 68.0 in | Wt 184.8 lb

## 2014-05-05 DIAGNOSIS — E1165 Type 2 diabetes mellitus with hyperglycemia: Principal | ICD-10-CM

## 2014-05-05 DIAGNOSIS — M79641 Pain in right hand: Secondary | ICD-10-CM

## 2014-05-05 DIAGNOSIS — M79609 Pain in unspecified limb: Secondary | ICD-10-CM

## 2014-05-05 DIAGNOSIS — I10 Essential (primary) hypertension: Secondary | ICD-10-CM

## 2014-05-05 DIAGNOSIS — IMO0001 Reserved for inherently not codable concepts without codable children: Secondary | ICD-10-CM

## 2014-05-05 LAB — POCT GLYCOSYLATED HEMOGLOBIN (HGB A1C): Hemoglobin A1C: 7.4

## 2014-05-05 MED ORDER — MELOXICAM 7.5 MG PO TABS: 7.5000 mg | ORAL_TABLET | Freq: Every day | ORAL | Status: DC

## 2014-05-05 NOTE — Progress Notes (Signed)
S:  This 58 y.o. AA male is here for DM follow-up; last A1c= 10.4. Much improved today. No FSBS documentation; no hypoglycemia reported. Pt had modified meal plan and is exercising daily. No GI upset w/ Metformin.  HTN- medication compliance is good; no reported fatigue, vision disturbances, CP or tightness, palpitations, SOB or DOE, edema, HA, dizziness, numbness, weakness or syncope.  R hand pain- R hand dominant. Onset within last 2-3 days. Weakness when trying to lift or grasp objects. No known trauma. No hx of previous injury/ fracture, gout, numbness or paresthesia or swelling. Took 1 regular strength ASA w/ temporary relief.  Patient Active Problem List   Diagnosis Date Noted  . Type II or unspecified type diabetes mellitus without mention of complication, uncontrolled 10/29/2013  . BPH (benign prostatic hypertrophy) 08/20/2012  . ALLERGIC RHINITIS 07/21/2008  . KNEE PAIN, RIGHT 07/21/2008  . HYPERTENSION, BENIGN ESSENTIAL 09/11/1978    Prior to Admission medications   Medication Sig Start Date End Date Taking? Authorizing Provider  aspirin 81 MG tablet Take 81 mg by mouth daily.   Yes Historical Provider, MD  desloratadine (CLARINEX REDITAB) 5 MG disintegrating tablet Take 1 tablet by mouth once a day. 10/29/13  Yes Maurice March, MD  fish oil-omega-3 fatty acids 1000 MG capsule Take 2 g by mouth daily.   Yes Historical Provider, MD  glucose blood test strip Use as instructed; test blood sugar twice a day. 10/29/13  Yes Maurice March, MD  hydrochlorothiazide (HYDRODIURIL) 25 MG tablet Take 1 tablet (25 mg total) by mouth daily. 10/29/13  Yes Maurice March, MD  metFORMIN (GLUCOPHAGE) 1000 MG tablet Take 1 tablet daily with largest meal for 2 weeks; increase dose to 1 tablet twice a day with meals. 10/29/13  Yes Maurice March, MD  OVER THE COUNTER MEDICATION Vitamin D 400 iu daily   Yes Historical Provider, MD  verapamil (COVERA HS) 240 MG (CO) 24 hr tablet Take 1  tablet (240 mg total) by mouth at bedtime. 10/29/13  Yes Maurice March, MD           History   Social History  . Marital Status: Married    Spouse Name: N/A    Number of Children: N/A  . Years of Education: N/A   Occupational History  . public service    Social History Main Topics  . Smoking status: Former Games developer  . Smokeless tobacco: Not on file  . Alcohol Use: Yes     Comment: 4 Drinks  . Drug Use: No  . Sexual Activity: Yes    Partners: Female   Other Topics Concern  . Not on file   Social History Narrative  . No narrative on file   Family History  Problem Relation Age of Onset  . Stroke Mother   . Hypertension Sister   . Heart disease Brother     died from heart attack  . Diabetes Maternal Grandmother   . Diabetes Brother     died from diabetic coma    ROS: As per HPI.   O: Filed Vitals:   05/05/14 1313  BP: 142/96  Pulse: 80  Temp: 98.1 F (36.7 C)  Resp: 16   GEN: I NAD; WN,WD. HENT: [place]/AT; EOMI w/ clear conj/sclerae. Ext ears/nose/orph normal; otherwise unremarkable. COR: RRR. No edema. LUNGS: Unlabored resp; normal rate. SKIN: W&D; intact w/o erythema or rashes. See DM FOOT EXAM. MS: L wrist/hand- normal appearance w/o areas of tenderness; ROM and strength normal.  R wrist/hand- normal appearance w/o areas of tenderness; ROM is normal. Strength-              Reduced oppositional strength in digits. NEURO: A&O x 3; Cns intact. Nonfocal.   Results for orders placed in visit on 05/05/14  POCT GLYCOSYLATED HEMOGLOBIN (HGB A1C)      Result Value Ref Range   Hemoglobin A1C 7.4      [facility] reading (PRIMARY) by  Dr. Audria Nine:  R wirst and hand- normal appearance w/o fracture, dislocation or bony lesions.  A/P: Type II or unspecified type diabetes mellitus without mention of complication, uncontrolled - Improved control; continue current medications. Plan: HM Diabetes Foot Exam, POCT glycosylated hemoglobin (Hb A1C)  HYPERTENSION,  BENIGN ESSENTIAL- Stable on current medication.  Pain of right hand - Plan: DG Hand Complete Right (no acute findings on xray). Conservative treatment- Meloxicam 7.5 mg 1 tab daily and supportive brace or elastic wrap and topical analgesic.  Meds ordered this encounter  Medications  . meloxicam (MOBIC) 7.5 MG tablet    Sig: Take 1 tablet (7.5 mg total) by mouth daily.    Dispense:  30 tablet    Refill:  1

## 2014-05-05 NOTE — Patient Instructions (Signed)
Wrist Pain A wrist sprain happens when the bands of tissue that hold the wrist joints together (ligament) stretch too much or tear. A wrist strain happens when muscles or bands of tissue that connect muscles to bones (tendons) are stretched or pulled. HOME CARE  Put ice on the injured area.  Put ice in a plastic bag.  Place a towel between your skin and the bag.  Leave the ice on for 15-20 minutes, 03-04 times a day, for the first 2 days.  Raise (elevate) the injured wrist to lessen puffiness (swelling).  Rest the injured wrist for at least 48 hours or as told by your doctor.  Wear a splint, cast, or an elastic wrap as told by your doctor.  Only take medicine as told by your doctor.  Follow up with your doctor as told. This is important. GET HELP RIGHT AWAY IF:   The fingers are puffy, very red, white, or cold and blue.  The fingers lose feeling (numb) or tingle.  The pain gets worse.  It is hard to move the fingers. MAKE SURE YOU:   Understand these instructions.  Will watch your condition.  Will get help right away if you are not doing well or get worse. Document Released: 02/14/2008 Document Revised: 11/20/2011 Document Reviewed: 10/19/2010 [facility] Patient Information 2015 [place], [place]. This information is not intended to replace advice given to you by your health care provider. Make sure you discuss any questions you have with your health care provider.   You can get an elastic wrap or brace for your wrist at your local pharmacy.  Take the medication prescribed once a day with food or snack.  Get a topical joint cream or gel ( Ben Gay, Frohna, Arnica Gel, etc) at your pharmacy and use as directed on the package.

## 2014-08-25 ENCOUNTER — Encounter: Payer: Self-pay | Admitting: Family Medicine

## 2014-08-25 ENCOUNTER — Ambulatory Visit (INDEPENDENT_AMBULATORY_CARE_PROVIDER_SITE_OTHER): Payer: BC Managed Care – PPO | Admitting: Family Medicine

## 2014-08-25 VITALS — BP 130/90 | HR 82 | Temp 98.1°F | Resp 16 | Ht 68.0 in | Wt 190.2 lb

## 2014-08-25 DIAGNOSIS — E119 Type 2 diabetes mellitus without complications: Secondary | ICD-10-CM

## 2014-08-25 DIAGNOSIS — Z23 Encounter for immunization: Secondary | ICD-10-CM

## 2014-08-25 LAB — CBC WITH DIFFERENTIAL/PLATELET
BASOS ABS: 0.1 10*3/uL (ref 0.0–0.1)
Basophils Relative: 1 % (ref 0–1)
EOS PCT: 1 % (ref 0–5)
Eosinophils Absolute: 0.1 10*3/uL (ref 0.0–0.7)
HCT: 44.1 % (ref 39.0–52.0)
HEMOGLOBIN: 15.7 g/dL (ref 13.0–17.0)
LYMPHS ABS: 3.3 10*3/uL (ref 0.7–4.0)
LYMPHS PCT: 53 % — AB (ref 12–46)
MCH: 30.8 pg (ref 26.0–34.0)
MCHC: 35.6 g/dL (ref 30.0–36.0)
MCV: 86.6 fL (ref 78.0–100.0)
MPV: 9.4 fL (ref 9.4–12.4)
Monocytes Absolute: 0.5 10*3/uL (ref 0.1–1.0)
Monocytes Relative: 8 % (ref 3–12)
NEUTROS PCT: 37 % — AB (ref 43–77)
Neutro Abs: 2.3 10*3/uL (ref 1.7–7.7)
Platelets: 337 10*3/uL (ref 150–400)
RBC: 5.09 MIL/uL (ref 4.22–5.81)
RDW: 13.6 % (ref 11.5–15.5)
WBC: 6.2 10*3/uL (ref 4.0–10.5)

## 2014-08-25 LAB — POCT GLYCOSYLATED HEMOGLOBIN (HGB A1C): HEMOGLOBIN A1C: 8.4

## 2014-08-25 LAB — POCT URINALYSIS DIPSTICK
BILIRUBIN UA: NEGATIVE
Glucose, UA: NEGATIVE
Ketones, UA: NEGATIVE
LEUKOCYTES UA: NEGATIVE
Nitrite, UA: NEGATIVE
Protein, UA: NEGATIVE
RBC UA: NEGATIVE
Spec Grav, UA: 1.02
UROBILINOGEN UA: 0.2
pH, UA: 5.5

## 2014-08-25 LAB — COMPREHENSIVE METABOLIC PANEL
ALT: 19 U/L (ref 0–53)
AST: 23 U/L (ref 0–37)
Albumin: 4.1 g/dL (ref 3.5–5.2)
Alkaline Phosphatase: 55 U/L (ref 39–117)
BILIRUBIN TOTAL: 0.5 mg/dL (ref 0.2–1.2)
BUN: 11 mg/dL (ref 6–23)
CALCIUM: 9.4 mg/dL (ref 8.4–10.5)
CHLORIDE: 98 meq/L (ref 96–112)
CO2: 28 meq/L (ref 19–32)
CREATININE: 0.96 mg/dL (ref 0.50–1.35)
Glucose, Bld: 139 mg/dL — ABNORMAL HIGH (ref 70–99)
Potassium: 4.1 mEq/L (ref 3.5–5.3)
Sodium: 136 mEq/L (ref 135–145)
Total Protein: 7 g/dL (ref 6.0–8.3)

## 2014-08-25 NOTE — Patient Instructions (Signed)
Diabetes Mellitus and Food It is important for you to manage your blood sugar (glucose) level. Your blood glucose level can be greatly affected by what you eat. Eating healthier foods in the appropriate amounts throughout the day at about the same time each day will help you control your blood glucose level. It can also help slow or prevent worsening of your diabetes mellitus. Healthy eating may even help you improve the level of your blood pressure and reach or maintain a healthy weight.  HOW CAN FOOD AFFECT ME? Carbohydrates Carbohydrates affect your blood glucose level more than any other type of food. Your dietitian will help you determine how many carbohydrates to eat at each meal and teach you how to count carbohydrates. Counting carbohydrates is important to keep your blood glucose at a healthy level, especially if you are using insulin or taking certain medicines for diabetes mellitus. Alcohol Alcohol can cause sudden decreases in blood glucose (hypoglycemia), especially if you use insulin or take certain medicines for diabetes mellitus. Hypoglycemia can be a life-threatening condition. Symptoms of hypoglycemia (sleepiness, dizziness, and disorientation) are similar to symptoms of having too much alcohol.  If your health care provider has given you approval to drink alcohol, do so in moderation and use the following guidelines:  Women should not have more than one drink per day, and men should not have more than two drinks per day. One drink is equal to:  12 oz of beer.  5 oz of wine.  1 oz of hard liquor.  Do not drink on an empty stomach.  Keep yourself hydrated. Have water, diet soda, or unsweetened iced tea.  Regular soda, juice, and other mixers might contain a lot of carbohydrates and should be counted. WHAT FOODS ARE NOT RECOMMENDED? As you make food choices, it is important to remember that all foods are not the same. Some foods have fewer nutrients per serving than other  foods, even though they might have the same number of calories or carbohydrates. It is difficult to get your body what it needs when you eat foods with fewer nutrients. Examples of foods that you should avoid that are high in calories and carbohydrates but low in nutrients include:  Trans fats (most processed foods list trans fats on the Nutrition Facts label).  Regular soda.  Juice.  Candy.  Sweets, such as cake, pie, doughnuts, and cookies.  Fried foods. WHAT FOODS CAN I EAT? Have nutrient-rich foods, which will nourish your body and keep you healthy. The food you should eat also will depend on several factors, including:  The calories you need.  The medicines you take.  Your weight.  Your blood glucose level.  Your blood pressure level.  Your cholesterol level. You also should eat a variety of foods, including:  Protein, such as meat, poultry, fish, tofu, nuts, and seeds (lean animal proteins are best).  Fruits.  Vegetables.  Dairy products, such as milk, cheese, and yogurt (low fat is best).  Breads, grains, pasta, cereal, rice, and beans.  Fats such as olive oil, trans fat-free margarine, canola oil, avocado, and olives. DOES EVERYONE WITH DIABETES MELLITUS HAVE THE SAME MEAL PLAN? Because every person with diabetes mellitus is different, there is not one meal plan that works for everyone. It is very important that you meet with a dietitian who will help you create a meal plan that is just right for you. Document Released: 05/25/2005 Document Revised: 09/02/2013 Document Reviewed: 07/25/2013 [employer] Patient Information 2015 [employer]. This   information is not intended to replace advice given to you by your health care provider. Make sure you discuss any questions you have with your health care provider.  

## 2014-08-25 NOTE — Progress Notes (Signed)
IDENTIFYING INFORMATION  Jack StampsRaymond Brady / DOB: 11/04/1955 / MRN: [id number]  The patient has HYPERTENSION, BENIGN ESSENTIAL; ALLERGIC RHINITIS; KNEE PAIN, RIGHT; BPH (benign prostatic hypertrophy); and Diabetes on his problem list.  SUBJECTIVE  CC: Diabetes   HPI: [name] is a 58 y.o. y.o. male presenting for a diabetic follow up. He is maintained on 1 gram metformin bid, and is compliant with therapy.  He denies sock and glove paraesthesias, chest pain, SOB, DOE, changes in urination and acute changes in vision or eye pain.  His blood pressure is maintained on HCTZ 25 mg daily, along with verapamil 240, and is in good control. He is a non smoker.  He is taking 81 mg ASA to reduce the risk of CAD. He checks his fasting blood sugar every three days, and reports that it runs around 120-140.  He had his last Optho exam in the summer, and he receives yearly follow up. He walks with his dog daily for roughly 20 min at a moderate pace.     He  has a past medical history of Hypertension and diabetes without complication.     He has a current medication list which includes the following prescription(s): aspirin, desloratadine, fish oil-omega-3 fatty acids, glucose blood, hydrochlorothiazide, meloxicam, metformin, and verapamil.  [name] has No Known Allergies. He  reports that he has quit smoking. His smoking use included Cigarettes. He has a 10 pack-year smoking history. He does not have any smokeless tobacco history on file. He reports that he drinks alcohol. He reports that he does not use illicit drugs. He  reports that he currently engages in sexual activity and has had male partners.  The patient  has no past surgical history on file.  His family history includes Diabetes in his brother and maternal grandmother; Heart disease in his brother; Hypertension in his sister; Stroke in his mother.  Review of Systems  Constitutional: Negative for fever, chills and weight loss.  Eyes:  Negative.   Respiratory: Negative for cough and wheezing.   Cardiovascular: Negative for chest pain and palpitations.  Gastrointestinal: Negative for heartburn, nausea, vomiting and abdominal pain.  Genitourinary: Negative.   Musculoskeletal: Negative for myalgias and joint pain.  Skin: Negative for itching and rash.  Neurological: Negative for dizziness and headaches.  Psychiatric/Behavioral: Negative for depression.    OBJECTIVE  Blood pressure 130/90, pulse 82, temperature 98.1 F (36.7 C), temperature source Oral, resp. rate 16, height 5\' 8"  (1.727 m), weight 190 lb 3.2 oz (86.274 kg), SpO2 97 %. Body mass index is 28.93 kg/(m^2).  Diabetic foot exam completed this visit by clinical staff and was found to be within normal limits.    Physical Exam  Constitutional: He is oriented to person, place, and time. He appears well-developed and well-nourished. No distress.  Eyes: No scleral icterus.  Cardiovascular: Normal rate, regular rhythm, normal heart sounds and intact distal pulses.  Exam reveals no gallop and no friction rub.   No murmur heard. Respiratory: Effort normal and breath sounds normal.  GI: He exhibits no distension.  Musculoskeletal: Normal range of motion.  Lymphadenopathy:    He has no cervical adenopathy.  Neurological: He is alert and oriented to person, place, and time. He has normal reflexes.  Skin: Skin is warm and dry. He is not diaphoretic.  Psychiatric: He has a normal mood and affect. His behavior is normal. Judgment and thought content normal.    Results for orders placed or performed in visit on 08/25/14 (  from the past 24 hour(s))  POCT urinalysis dipstick     Status: None   Collection Time: 08/25/14  2:46 PM  Result Value Ref Range   Color, UA Yellow    Clarity, UA Clear    Glucose, UA Negative    Bilirubin, UA Negative    Ketones, UA Negative    Spec Grav, UA 1.020    Blood, UA Negative    pH, UA 5.5    Protein, UA Negative    Urobilinogen,  UA 0.2    Nitrite, UA Negative    Leukocytes, UA Negative   POCT glycosylated hemoglobin (Hb A1C)     Status: None   Collection Time: 08/25/14  2:49 PM  Result Value Ref Range   Hemoglobin A1C 8.4     ASSESSMENT & PLAN  Jack SalvoRaymond was seen today for diabetes.  Diagnoses and associated orders for this visit:  Type 2 diabetes mellitus without complication: Patient asymptomatic at this time.  A1C elevated relative to past numbers.  Patient admits his diet has been "not so good" over the last few months.  Advised he stop drinking juice in the morning, to cut his fruit consumption by 1/2, and to abstain from sweets altogether.  Should this fail by his next check, Janumet is the next reasonable step in therapy.   - Microalbumin, urine - Comprehensive metabolic panel - POCT glycosylated hemoglobin (Hb A1C) - HM Diabetes Foot Exam - POCT urinalysis dipstick - CBC with Differential  Need for prophylactic vaccination against Streptococcus pneumoniae (pneumococcus) - Cancel: Pneumococcal polysaccharide vaccine 23-valent greater than or equal to 2yo subcutaneous/IM - Pneumococcal conjugate vaccine 13-valent IM     The patient was instructed to to call or comeback to clinic as needed, or should symptoms warrant.  Jack BostonMichael Brady, MHS, PA-C [facility] 08/25/2014 2:53 PM

## 2014-08-25 NOTE — Progress Notes (Signed)
This 58 y.o. AA male is here for DM and HTN follow-up. He has been stable on current medications for more than 12 months. A1c has been improved from 10.4% down to 7.4% in August 2015.  Patient Active Problem List   Diagnosis Date Noted  . Diabetes 10/29/2013  . BPH (benign prostatic hypertrophy) 08/20/2012  . ALLERGIC RHINITIS 07/21/2008  . KNEE PAIN, RIGHT 07/21/2008  . HYPERTENSION, BENIGN ESSENTIAL 09/11/1978     I have reviewed M. Chestine Sporelark, PA-C's note after discussion; agree with documentation and plan for care. Nothing to add. A1c= 8.4%; pt will make nutrition changes and will continue same medications.  Will follow-up in 3 months.  Maurice MarchBarbara B. Deaundra Kutzer, MD [facility]

## 2014-08-26 LAB — MICROALBUMIN, URINE: Microalb, Ur: 1.1 mg/dL (ref <2.0)

## 2014-08-30 NOTE — Progress Notes (Signed)
Quick Note:  Please notify pt that results are normal.   Provide pt with copy of labs. ______ 

## 2014-12-01 ENCOUNTER — Encounter: Payer: BC Managed Care – PPO | Admitting: Family Medicine

## 2015-02-09 ENCOUNTER — Ambulatory Visit (INDEPENDENT_AMBULATORY_CARE_PROVIDER_SITE_OTHER): Payer: BC Managed Care – PPO | Admitting: Physician Assistant

## 2015-02-09 VITALS — BP 132/90 | HR 80 | Temp 98.5°F | Resp 16 | Ht 68.0 in | Wt 185.4 lb

## 2015-02-09 DIAGNOSIS — I10 Essential (primary) hypertension: Secondary | ICD-10-CM | POA: Diagnosis not present

## 2015-02-09 DIAGNOSIS — E119 Type 2 diabetes mellitus without complications: Secondary | ICD-10-CM | POA: Diagnosis not present

## 2015-02-09 DIAGNOSIS — L03012 Cellulitis of left finger: Secondary | ICD-10-CM

## 2015-02-09 DIAGNOSIS — M79645 Pain in left finger(s): Secondary | ICD-10-CM | POA: Diagnosis not present

## 2015-02-09 LAB — COMPLETE METABOLIC PANEL WITH GFR
ALT: 26 U/L (ref 0–53)
AST: 23 U/L (ref 0–37)
Albumin: 4.2 g/dL (ref 3.5–5.2)
Alkaline Phosphatase: 61 U/L (ref 39–117)
BILIRUBIN TOTAL: 0.6 mg/dL (ref 0.2–1.2)
BUN: 13 mg/dL (ref 6–23)
CO2: 24 mEq/L (ref 19–32)
Calcium: 9.3 mg/dL (ref 8.4–10.5)
Chloride: 99 mEq/L (ref 96–112)
Creat: 0.88 mg/dL (ref 0.50–1.35)
GFR, Est African American: >89 mL/min
Glucose, Bld: 174 mg/dL — ABNORMAL HIGH (ref 70–99)
Potassium: 4.2 mEq/L (ref 3.5–5.3)
SODIUM: 136 meq/L (ref 135–145)
TOTAL PROTEIN: 7.3 g/dL (ref 6.0–8.3)

## 2015-02-09 MED ORDER — VERAPAMIL HCL 240 MG (CO) PO TB24: 240.0000 mg | ORAL_TABLET | Freq: Every day | ORAL | Status: AC

## 2015-02-09 MED ORDER — TRAMADOL HCL 50 MG PO TABS: 50.0000 mg | ORAL_TABLET | Freq: Three times a day (TID) | ORAL | Status: DC | PRN

## 2015-02-09 MED ORDER — DOXYCYCLINE HYCLATE 100 MG PO CAPS: 100.0000 mg | ORAL_CAPSULE | Freq: Two times a day (BID) | ORAL | Status: DC

## 2015-02-09 MED ORDER — METFORMIN HCL 1000 MG PO TABS: 1000.0000 mg | ORAL_TABLET | Freq: Two times a day (BID) | ORAL | Status: AC

## 2015-02-09 MED ORDER — HYDROCHLOROTHIAZIDE 25 MG PO TABS: 25.0000 mg | ORAL_TABLET | Freq: Every day | ORAL | Status: AC

## 2015-02-09 MED ORDER — GLUCOSE BLOOD VI STRP: ORAL_STRIP | Status: DC

## 2015-02-09 NOTE — Progress Notes (Signed)
02/09/2015 at 11:13 AM  Jack Brady / DOB: 1955/11/01 / MRN: [id number]  The patient has HYPERTENSION, BENIGN ESSENTIAL; ALLERGIC RHINITIS; KNEE PAIN, RIGHT; BPH (benign prostatic hypertrophy); and Diabetes on his problem list.  SUBJECTIVE  Chief complaint: Other and Medication Refill  Patient here for left sided moderate throbbing thumb pain that started roughly 4 days ago.  Reports that he had a paronychia about 1 week ago and this has since healed over.  Now states that the area is tender to touch and is keeping him awake at night. Denies fever, chills, nausea, paresthesia, and hand or elbow joint pain.   He needs a refill of his diabetes and hypertension medication today.  He was last seen for these problems in December 2015 and his A1C was elevated at 8.4 and the remainder of his labs were unremarkable.   He  has a past medical history of Hypertension.    Medications reviewed and updated by myself where necessary, and exist elsewhere in the encounter.   [name] has No Known Allergies. He  reports that he has quit smoking. His smoking use included Cigarettes. He has a 10 pack-year smoking history. He does not have any smokeless tobacco history on file. He reports that he drinks alcohol. He reports that he does not use illicit drugs. He  reports that he currently engages in sexual activity and has had male partners. The patient  has no past surgical history on file.  His family history includes Diabetes in his brother and maternal grandmother; Heart disease in his brother; Hypertension in his sister; Stroke in his mother.  Review of Systems  Respiratory: Negative for cough.   Musculoskeletal: Negative for falls.  Skin: Negative for itching.  Neurological: Negative for dizziness and headaches.    OBJECTIVE   His  height is  (1.727 m) and weight is 185 lb 6.4 oz (84.097 kg). His oral temperature is 98.5 F (36.9 C). His blood pressure is 132/90 and his pulse is 80. His  respiration is 16 and oxygen saturation is 97%.  The patient's body mass index is 28.2 kg/(m^2).  Physical Exam  Nursing note and vitals reviewed. Constitutional: He is oriented to person, place, and time. He appears well-developed and well-nourished.  Cardiovascular: Normal rate.   Respiratory: Effort normal.  GI: Soft.  Musculoskeletal: Normal range of motion.       Hands: Neurological: He is alert and oriented to person, place, and time. He has normal strength. No cranial nerve deficit or sensory deficit.  Skin: Skin is warm and dry. There is erythema.   Procedure: Verbal consent obtained.  Patient anesthetized via digital block with 1:1 mix of 2% lidocaine without and Marcaine. Wound prepped with betadine.  Hockey stick incision made on the medial aspect starting 1 cm inferior to the nail.  Copious pus expressed.  Patient tolerated procedure without difficulty.  Would culture obtained.    No results found for this or any previous visit (from the past 24 hour(s)).  ASSESSMENT & PLAN  Camara was seen today for other and medication refill.  Diagnoses and all orders for this visit:  Paronychia of thumb, left Orders: -     PR SURGICAL TRAYS -     Wound culture -     doxycycline (VIBRAMYCIN) 100 MG capsule; Take 1 capsule (100 mg total) by mouth 2 (two) times daily. -     PR DRAIN SKIN ABSCESS SIMPLE  Thumb pain, left Orders: -  traMADol (ULTRAM) 50 MG tablet; Take 1 tablet (50 mg total) by mouth every 8 (eight) hours as needed.  HYPERTENSION, BENIGN ESSENTIAL Orders: -     verapamil (COVERA HS) 240 MG (CO) 24 hr tablet; Take 1 tablet (240 mg total) by mouth at bedtime. -     hydrochlorothiazide (HYDRODIURIL) 25 MG tablet; Take 1 tablet (25 mg total) by mouth daily. -     COMPLETE METABOLIC PANEL WITH GFR  Type 2 diabetes mellitus without complication Orders: -     metFORMIN (GLUCOPHAGE) 1000 MG tablet; Take 1 tablet (1,000 mg total) by mouth 2 (two) times daily with a  meal. -     glucose blood test strip; Use as instructed; test blood sugar twice a day. -     Hemoglobin A1c    The patient was advised to call or come back to clinic if he does not see an improvement in symptoms, or worsens with the above plan.   Deliah BostonMichael Clark, MHS, PA-C [facility] 02/09/2015 11:13 AM

## 2015-02-10 ENCOUNTER — Telehealth: Payer: Self-pay

## 2015-02-10 LAB — HEMOGLOBIN A1C
Hgb A1c MFr Bld: 8.9 % — ABNORMAL HIGH (ref <5.7)
Mean Plasma Glucose: 209 mg/dL — ABNORMAL HIGH (ref <117)

## 2015-02-10 NOTE — Telephone Encounter (Signed)
Pharm called to report that pt has been getting the generic verapamil ER 240 mg, but she wanted us to be aware that it is only a 12 hr tablet, not a 24 hr tablet like brand name Covera HS. The only 24 hr verapamil is the brand Covera which is $1000. As I said, pt has been getting the 12 generic, but I see that his BP isn't quite normal so didn't know whether you would want him taking it BID before I OKd them just filling it as he has been taking it.

## 2015-02-12 ENCOUNTER — Ambulatory Visit (INDEPENDENT_AMBULATORY_CARE_PROVIDER_SITE_OTHER): Payer: BC Managed Care – PPO | Admitting: Physician Assistant

## 2015-02-12 VITALS — BP 118/88 | HR 78 | Temp 98.8°F | Resp 18 | Ht 68.0 in | Wt 185.6 lb

## 2015-02-12 DIAGNOSIS — L03012 Cellulitis of left finger: Secondary | ICD-10-CM | POA: Diagnosis not present

## 2015-02-12 DIAGNOSIS — E119 Type 2 diabetes mellitus without complications: Secondary | ICD-10-CM

## 2015-02-12 MED ORDER — CANAGLIFLOZIN 100 MG PO TABS
100.0000 mg | ORAL_TABLET | Freq: Every day | ORAL | Status: DC
Start: 2015-02-12 — End: 2019-06-18

## 2015-02-12 NOTE — Progress Notes (Signed)
02/12/2015 at 10:56 AM  Jack Brady / DOB: 1955/11/29 / MRN: [id number]  The patient has HYPERTENSION, BENIGN ESSENTIAL; ALLERGIC RHINITIS; KNEE PAIN, RIGHT; BPH (benign prostatic hypertrophy); and Diabetes on his problem list.  SUBJECTIVE  Chief complaint: Wound Check  Patient here for wound check and DM2 medication titration.  He was here on 5/31 for incision and drainage of a left thumb paronychia.  He was given doxycycline and has been taking this without incident.    His most recent A1C came back abnormally high for him.  He is amenable to starting a second DM2 agent today. He denies a history of UTI.  He is walking daily and tries to avoid sweets and sugary drinks.    He  has a past medical history of Hypertension.    Medications reviewed and updated by myself where necessary, and exist elsewhere in the encounter.   Jack Brady has No Known Allergies. He  reports that he has quit smoking. His smoking use included Cigarettes. He has a 10 pack-year smoking history. He does not have any smokeless tobacco history on file. He reports that he drinks alcohol. He reports that he does not use illicit drugs. He  reports that he currently engages in sexual activity and has had male partners. The patient  has no past surgical history on file.  His family history includes Diabetes in his brother and maternal grandmother; Heart disease in his brother; Hypertension in his sister; Stroke in his mother.  Review of Systems  Constitutional: Negative for fever.  Respiratory: Negative for cough.   Cardiovascular: Negative for chest pain.  Gastrointestinal: Negative for nausea.  Genitourinary: Negative for dysuria.  Skin: Negative for rash.  Neurological: Negative for dizziness and headaches.    OBJECTIVE  His  height is 5\' 8"  (1.727 m) and weight is 185 lb 9.6 oz (84.188 kg). His oral temperature is 98.8 F (37.1 C). His blood pressure is 118/88 and his pulse is 78. His respiration is 18 and  oxygen saturation is 98%.  The patient's body mass index is 28.23 kg/(m^2).  Physical Exam  Vitals reviewed. Constitutional: He is oriented to person, place, and time. He appears well-developed and well-nourished.  Cardiovascular: Normal rate.   Respiratory: Effort normal.  Musculoskeletal: Normal range of motion.  Neurological: He is alert and oriented to person, place, and time.  Psychiatric: He has a normal mood and affect.   Results for Jack Brady, Jack Brady (MRN [id number]) as of 02/12/2015 10:49  Ref. Range 12/10/2013 10:40 05/05/2014 13:41 08/25/2014 14:26 08/25/2014 14:49 02/09/2015 11:22  Hemoglobin A1C Latest Ref Range: <5.7 % 10.4 7.4  8.4 8.9 (H)  Glucose Latest Ref Range: 70-99 mg/dL   086139 (H)  578174 (H)    No results found for this or any previous visit (from the past 24 hour(s)).   ASSESSMENT & PLAN  Jack Brady was seen today for wound check.  Diagnoses and all orders for this visit:  Paronychia of thumb, left  Type 2 diabetes mellitus without complication Orders: -     canagliflozin (INVOKANA) 100 MG TABS tablet; Take 1 tablet (100 mg total) by mouth daily. -     Ambulatory referral to diabetic education    The patient was advised to call or come back to clinic if he does not see an improvement in symptoms, or worsens with the above plan.   Deliah BostonMichael Shavawn Stobaugh, MHS, PA-C [facility] 02/12/2015 10:56 AM

## 2015-02-13 LAB — WOUND CULTURE: Gram Stain: NONE SEEN

## 2015-02-19 ENCOUNTER — Encounter: Payer: Self-pay | Admitting: Family Medicine

## 2015-04-06 ENCOUNTER — Ambulatory Visit: Payer: Self-pay | Admitting: Dietician

## 2015-06-29 IMAGING — CR DG HAND COMPLETE 3+V*R*
4 series · 4 of 4 positions shown · non-contrast
Comparison: None.

CLINICAL DATA: Pain

EXAM:
RIGHT HAND - COMPLETE 3+ VIEW

[PA (1 of 2)]
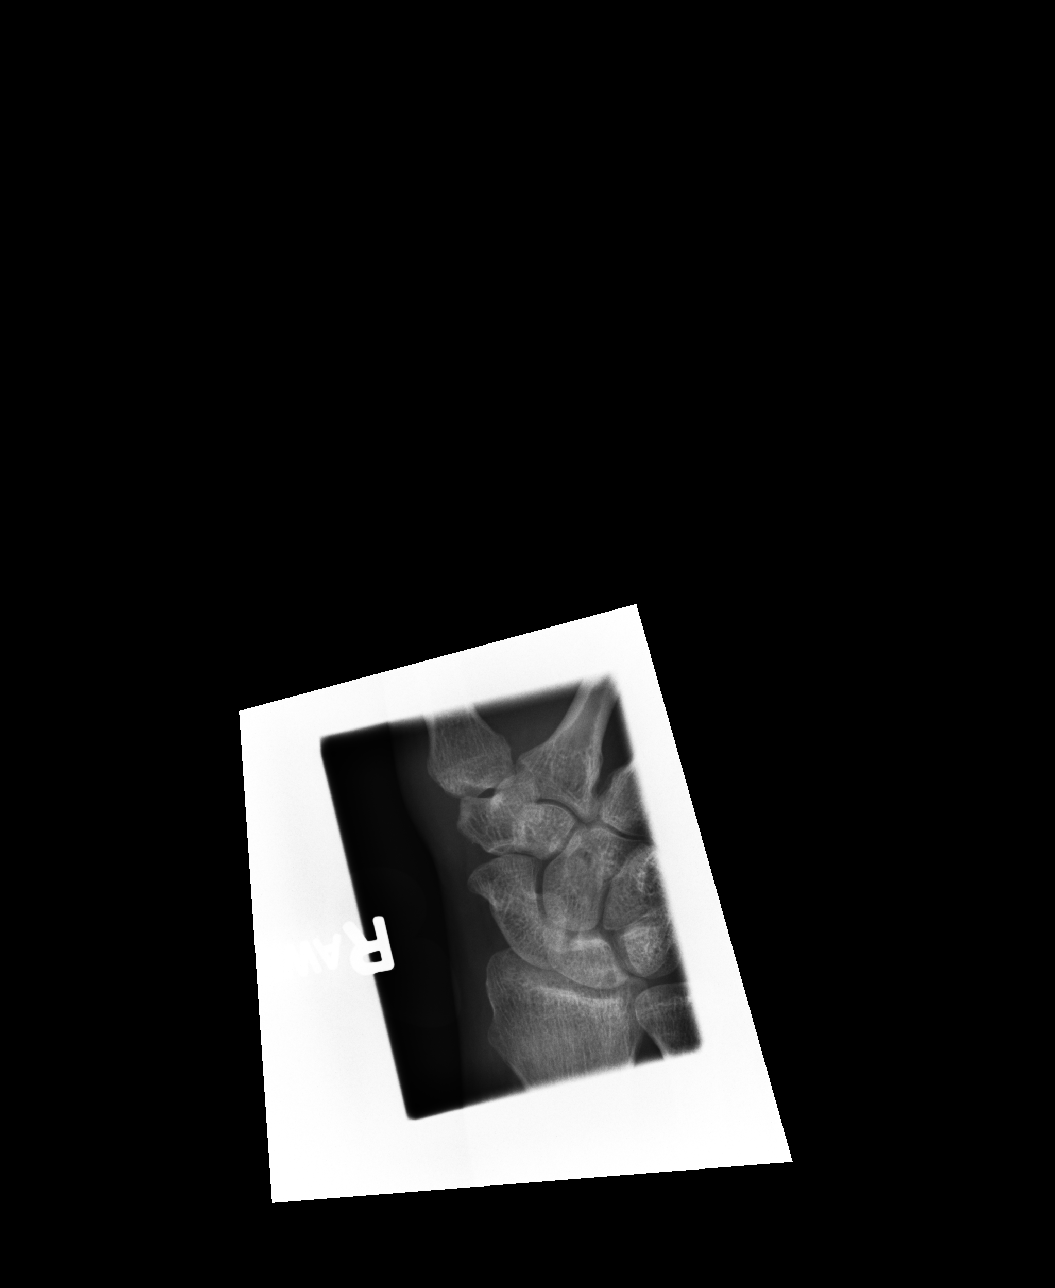

[lateral]
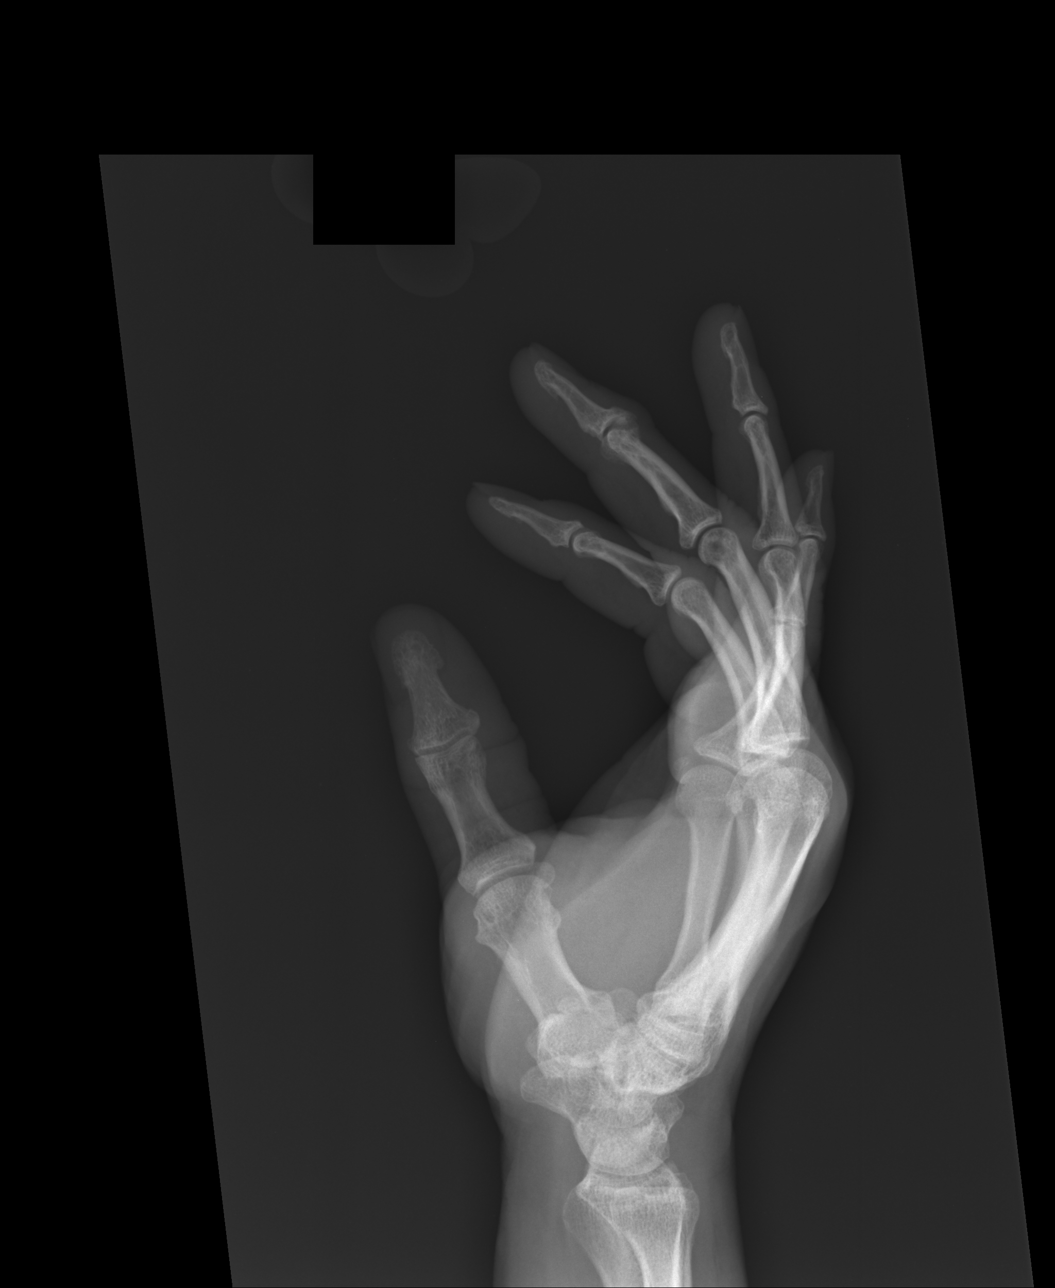

[PA (2 of 2)]
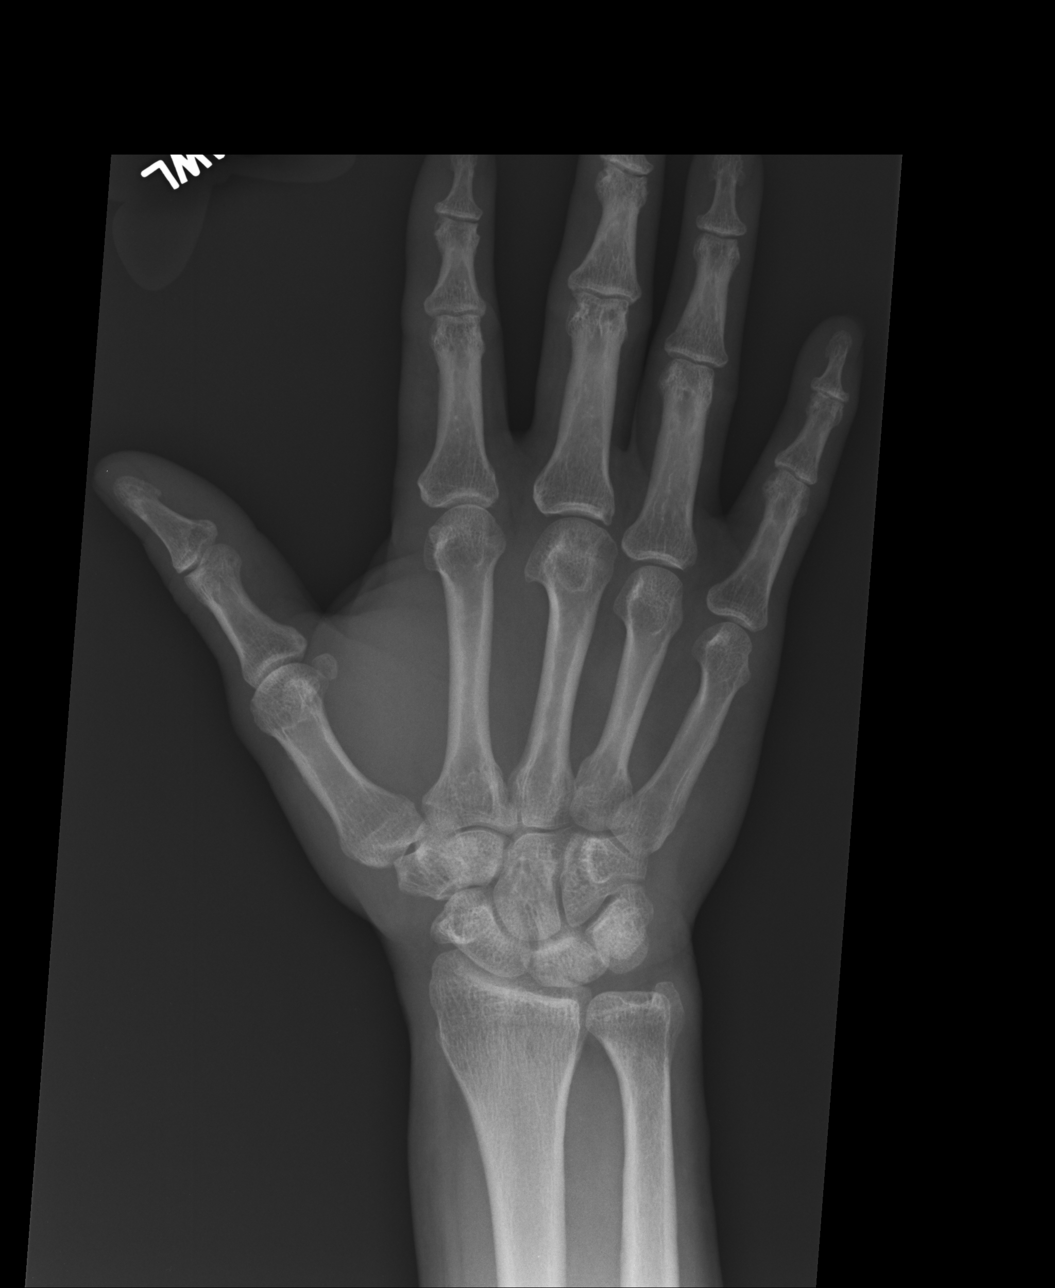

[oblique]
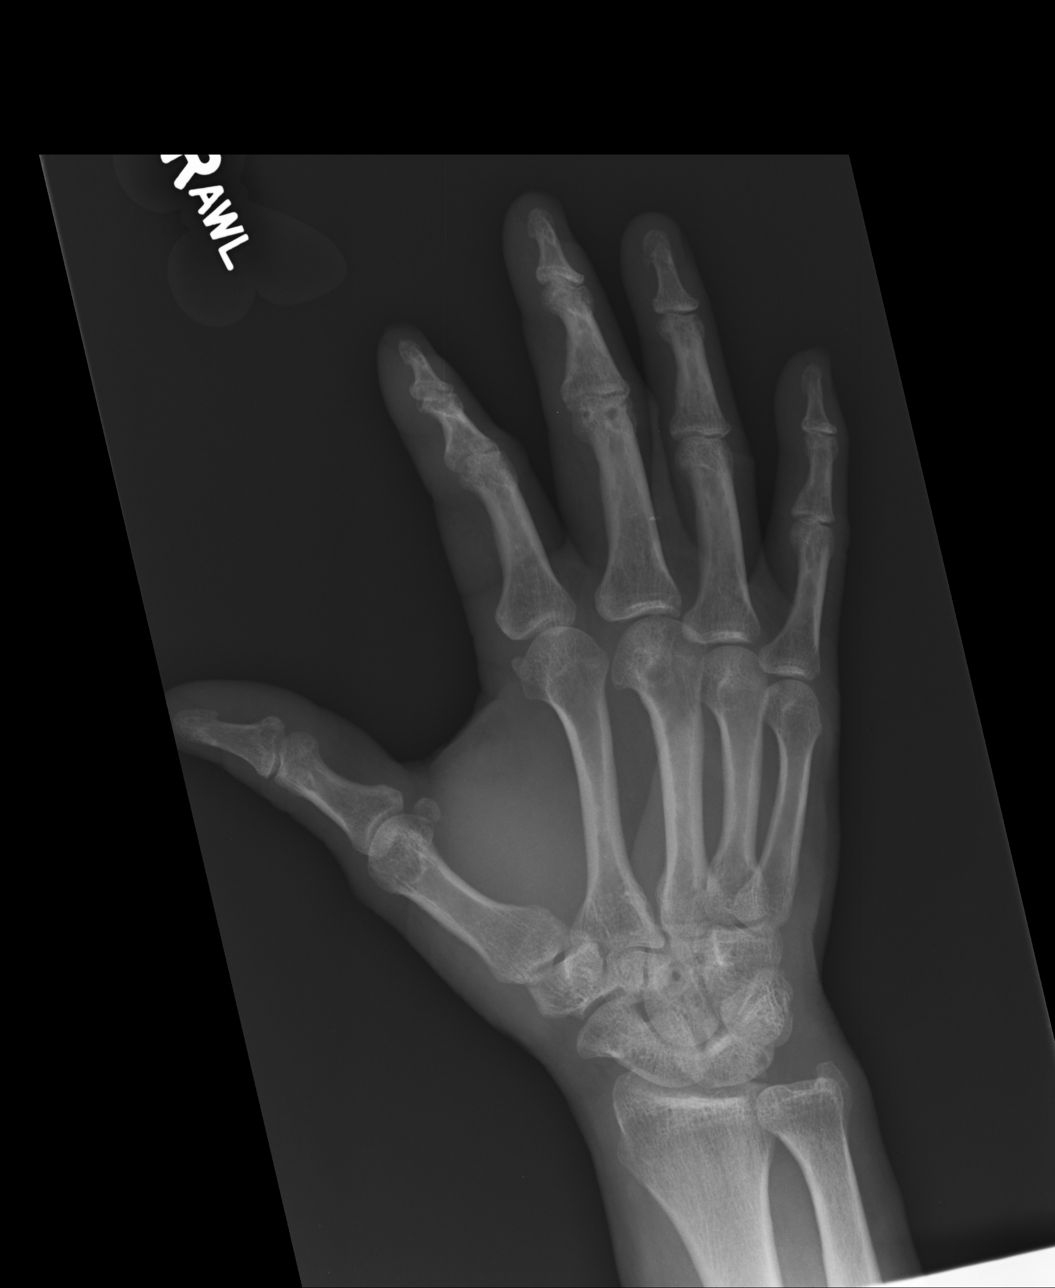

[4 of 4 positions shown; findings below may reference images not displayed]

FINDINGS: There is no evidence of fracture or dislocation. Degenerative
changes are noted involving the DIP joint of the third digit. Soft
tissues are unremarkable.
IMPRESSION: 1. No acute findings.
2. Osteoarthritis involving the third DIP joint

## 2015-09-16 ENCOUNTER — Encounter: Payer: Self-pay | Admitting: Gastroenterology

## 2016-06-16 ENCOUNTER — Ambulatory Visit (INDEPENDENT_AMBULATORY_CARE_PROVIDER_SITE_OTHER): Payer: BC Managed Care – PPO | Admitting: Physician Assistant

## 2016-06-16 ENCOUNTER — Other Ambulatory Visit: Payer: Self-pay | Admitting: Physician Assistant

## 2016-06-16 VITALS — BP 130/88 | HR 81 | Temp 98.1°F | Resp 16 | Ht 67.5 in | Wt 183.2 lb

## 2016-06-16 DIAGNOSIS — E119 Type 2 diabetes mellitus without complications: Secondary | ICD-10-CM | POA: Diagnosis not present

## 2016-06-16 DIAGNOSIS — Z0000 Encounter for general adult medical examination without abnormal findings: Secondary | ICD-10-CM | POA: Diagnosis not present

## 2016-06-16 DIAGNOSIS — Z1159 Encounter for screening for other viral diseases: Secondary | ICD-10-CM | POA: Diagnosis not present

## 2016-06-16 DIAGNOSIS — Z23 Encounter for immunization: Secondary | ICD-10-CM

## 2016-06-16 DIAGNOSIS — Z125 Encounter for screening for malignant neoplasm of prostate: Secondary | ICD-10-CM | POA: Diagnosis not present

## 2016-06-16 LAB — HEPATITIS C ANTIBODY: HCV AB: NEGATIVE

## 2016-06-16 LAB — PSA: PSA: 0.4 ng/mL (ref <=4.0)

## 2016-06-16 LAB — MICROALBUMIN, URINE: MICROALB UR: 0.7 mg/dL

## 2016-06-16 MED ORDER — GLUCOSE BLOOD VI STRP: ORAL_STRIP | 12 refills | Status: AC

## 2016-06-16 NOTE — Progress Notes (Signed)
Patient ID: Jack Brady, male   DOB: 1956-02-12, 60 y.o.   MRN: [id number]   By signing my name below I, Shelah Lewandowsky, attest that this documentation has been prepared under the direction and in the presence of Deliah Boston, [place]. Electonically Signed. Shelah Lewandowsky, Scribe 06/16/2016 at 9:38 AM  06/16/2016 9:41 AM   DOB: [birth year] / MRN: [id number]  SUBJECTIVE:  Jack Brady is a 60 y.o. male presenting for annual physical.  Pt is followed by the [facility] hospital and states he just had medication refill a month ago through the [place]. Pt requesting diabetic test strips since they are not covered by the [place].  [facility] is monitoring pt's PSA every few years. Pt's last PSA was in 2015. Will get PSA today.  Pt also seen by an opthalmoloist through the [place] this year.  Pt exercises daily and does at least 15 minutes of cardio every morning.  Pt has history of DM. Pt states that he checks his fasting blood sugar every other morning and has not had a fasting blood sugar over 140 in several months. Pt's sugar this morning was 135.   Pt also has history of HTN and HLD and is getting medications through the [place].  Last colonoscopy was in 2010. Pt scheduled for a 10 year follow up at that time.  Hep C screen has not been done per [facility]  HIV screening done in January 2010.  Pt requesting flu and pneumonia vaccinations today.  Depression screen [facility] 2/9 06/16/2016 02/12/2015 02/09/2015 12/10/2013  Decreased Interest 0 0 0 0  Down, Depressed, Hopeless 0 0 0 0  PHQ - 2 Score 0 0 0 0     Immunization History  Administered Date(s) Administered  . Hepatitis B 05/12/2010  . Influenza-Unspecified 06/11/2014  . Pneumococcal Conjugate-13 08/25/2014  . Td 09/11/2006  . Tdap 08/16/2012     He has No Known Allergies.   He  has a past medical history of Hypertension.    He  reports that he has quit smoking. His smoking use included Cigarettes. He has a 10.00 pack-year smoking history. He does not have any  smokeless tobacco history on file. He reports that he drinks alcohol. He reports that he does not use drugs. He  reports that he currently engages in sexual activity and has had male partners. The patient  has no past surgical history on file.  His family history includes Diabetes in his brother and maternal grandmother; Heart disease in his brother; Hypertension in his sister; Stroke in his mother.  Review of Systems  Constitutional: Negative for fever and malaise/fatigue.  HENT: Negative for congestion.   Respiratory: Negative for shortness of breath.   Cardiovascular: Negative for chest pain.  Skin: Negative for rash.  Neurological: Negative for dizziness, weakness and headaches.  Psychiatric/Behavioral: Negative for depression.    The problem list and medications were reviewed and updated by myself where necessary and exist elsewhere in the encounter.   OBJECTIVE:  BP 130/88   Pulse 81   Temp 98.1 F (36.7 C) (Oral)   Resp 16   Ht 5' 7.5" (1.715 m)   Wt 183 lb 3.2 oz (83.1 kg)   SpO2 96%   BMI 28.27 kg/m   Physical Exam  Constitutional: He is oriented to person, place, and time. He appears well-developed and well-nourished. No distress.  HENT:  Head: Normocephalic and atraumatic.  Mouth/Throat: Oropharynx is clear and moist and mucous membranes are normal. No oropharyngeal exudate or posterior oropharyngeal erythema.  Eyes: Conjunctivae are normal. Pupils are equal, round, and reactive to light.  Neck: Neck supple.  Cardiovascular: Normal rate, regular rhythm and normal heart sounds.  Exam reveals no gallop and no friction rub.   No murmur heard. Pulses:      Radial pulses are 2+ on the right side, and 2+ on the left side.       Posterior tibial pulses are 2+ on the right side, and 2+ on the left side.  Pulmonary/Chest: Effort normal and breath sounds normal. No respiratory distress. He has no decreased breath sounds. He has no wheezes. He has no rhonchi. He has no rales.   Musculoskeletal: Normal range of motion.  Neurological: He is alert and oriented to person, place, and time.  Skin: Skin is warm and dry. No lesion noted.  Skin on pt's feet bilat is supple with no lesions.   Psychiatric: He has a normal mood and affect. His behavior is normal.  Nursing note and vitals reviewed.   No results found for this or any previous visit (from the past 72 hour(s)).  No results found.   Lab Results  Component Value Date   HGBA1C 8.9 (H) 02/09/2015   Lab Results  Component Value Date   MICROALBUR 1.1 08/25/2014   Lab Results  Component Value Date   CREATININE 0.88 02/09/2015   Lab Results  Component Value Date   WBC 6.2 08/25/2014   HGB 15.7 08/25/2014   HCT 44.1 08/25/2014   MCV 86.6 08/25/2014   PLT 337 08/25/2014   No results found for: HIV1X2 Diabetic Foot Exam - Simple   Simple Foot Form Diabetic Foot exam was performed with the following findings:  Yes 06/16/2016  9:24 AM  Visual Inspection No deformities, no ulcerations, no other skin breakdown bilaterally:  Yes Sensation Testing Intact to touch and monofilament testing bilaterally:  Yes See comments:  Yes Pulse Check Posterior Tibialis and Dorsalis pulse intact bilaterally:  Yes Comments Pt states he has intermitting numbness in his toes    Lab Results  Component Value Date   PSA 0.55 10/29/2013   PSA 0.51 08/16/2012   PSA 0.65 09/14/2008     ASSESSMENT AND PLAN  Jack Brady was seen today for annual exam and medication refill.  Diagnoses and all orders for this visit:  Annual physical exam  Type 2 diabetes mellitus without complication, without long-term current use of insulin (HCC) -     Microalbumin, urine -     HM Diabetes Foot Exam -     Ambulatory referral to Ophthalmology -     glucose blood test strip; Use as instructed; test blood sugar twice a day. E11.9  Need for prophylactic vaccination against Streptococcus pneumoniae (pneumococcus) -     Pneumococcal  polysaccharide vaccine 23-valent greater than or equal to 2yo subcutaneous/IM  Needs flu shot -     POCT Influenza A/B  Need for hepatitis C screening test -     Hepatitis C antibody  Screening for prostate cancer -     PSA     The patient is advised to call or return to clinic if he does not see an improvement in symptoms, or to seek the care of the closest emergency department if he worsens with the above plan.   Deliah BostonMichael Denishia Citro, MHS, PA-C [facility] 06/16/2016 9:41 AM  This note was scribed in my presence and I performed the services described in the this documentation.

## 2016-06-17 NOTE — Progress Notes (Signed)
Please add on an A1c. Deliah BostonMichael Kristin Barcus, MS, PA-C 8:30 PM, 06/17/2016

## 2016-06-20 LAB — HEMOGLOBIN A1C
HEMOGLOBIN A1C: 7.2 % — AB (ref <5.7)
Mean Plasma Glucose: 160 mg/dL

## 2016-06-21 ENCOUNTER — Encounter: Payer: Self-pay | Admitting: *Deleted

## 2017-03-28 ENCOUNTER — Encounter: Payer: Self-pay | Admitting: Physician Assistant

## 2017-03-28 ENCOUNTER — Ambulatory Visit (INDEPENDENT_AMBULATORY_CARE_PROVIDER_SITE_OTHER): Payer: BC Managed Care – PPO | Admitting: Physician Assistant

## 2017-03-28 VITALS — BP 151/95 | HR 77 | Temp 98.1°F | Resp 18 | Ht 68.11 in | Wt 189.2 lb

## 2017-03-28 DIAGNOSIS — H109 Unspecified conjunctivitis: Secondary | ICD-10-CM | POA: Diagnosis not present

## 2017-03-28 MED ORDER — POLYMYXIN B-TRIMETHOPRIM 10000-0.1 UNIT/ML-% OP SOLN: 1.0000 [drp] | Freq: Four times a day (QID) | OPHTHALMIC | 0 refills | Status: AC

## 2017-03-28 NOTE — Patient Instructions (Addendum)
Please use the drops as prescribed for 5-7 days. You should have improvement in 24-48 hours. If you do not have any improvement in 2 days, please return to clinic. If any of your symptoms worsen or you develop new symptoms such as decreased vision, pain with eye movements, pain with looking at light, fever, nausea, or vomiting, please seek care immediately. Thank you for letting me participate in your health and well being.    Bacterial Conjunctivitis Bacterial conjunctivitis is an infection of your conjunctiva. This is the clear membrane that covers the white part of your eye and the inner surface of your eyelid. This condition can make your eye:  Red or pink.  Itchy.  This condition is caused by bacteria. This condition spreads very easily from person to person (is contagious) and from one eye to the other eye. Follow these instructions at home: Medicines  Take or apply your antibiotic medicine as told by your doctor. Do not stop taking or applying the antibiotic even if you start to feel better.  Take or apply over-the-counter and prescription medicines only as told by your doctor.  Do not touch your eyelid with the eye drop bottle or the ointment tube. Managing discomfort  Wipe any fluid from your eye with a warm, wet washcloth or a cotton ball.  Place a cool, clean washcloth on your eye. Do this for 10-20 minutes, 3-4 times per day. General instructions  Do not wear contact lenses until the irritation is gone. Wear glasses until your doctor says it is okay to wear contacts.  Do not wear eye makeup until your symptoms are gone. Throw away any old makeup.  Change or wash your pillowcase every day.  Do not share towels or washcloths with anyone.  Wash your hands often with soap and water. Use paper towels to dry your hands.  Do not touch or rub your eyes.  Do not drive or use heavy machinery if your vision is blurry. Contact a doctor if:  You have a fever.  Your symptoms  do not get better after 10 days. Get help right away if:  You have a fever and your symptoms suddenly get worse.  You have very bad pain when you move your eye.  Your face: ? Hurts. ? Is red. ? Is swollen.  You have sudden loss of vision. This information is not intended to replace advice given to you by your health care provider. Make sure you discuss any questions you have with your health care provider. Document Released: 06/06/2008 Document Revised: 02/03/2016 Document Reviewed: 06/10/2015 Elsevier Interactive Patient Education  2018 [employer]     IF you received an x-ray today, you will receive an invoice from [facility]. Please contact [facility] at [phone] with questions or concerns regarding your invoice.   IF you received labwork today, you will receive an invoice from [facility]. Please contact [facility] at [phone] with questions or concerns regarding your invoice.   Our billing staff will not be able to assist you with questions regarding bills from these companies.  You will be contacted with the lab results as soon as they are available. The fastest way to get your results is to activate your My Chart account. Instructions are located on the last page of this paperwork. If you have not heard from us regarding the results in 2 weeks, please contact this office.

## 2017-03-28 NOTE — Progress Notes (Signed)
Jack Brady  MRN: [id number] DOB: 1956/01/19  Subjective:  Jack Brady is a 61 y.o. male seen in office today for a chief complaint of right eye irritation x 5 days. Had gritty sensation, watery discharge, redness, and swelling.Notes the eye had thick discharge this morning when he woke up and he had to pry it open. Denies acute injury,  itching, and visual disturbance. Works as a Building surveyor. Denies contact lens use. Has hx of seasonal allergies, does not take anything daily.   Review of Systems  Constitutional: Negative for chills, diaphoresis and fever.  HENT: Negative for congestion, ear pain, rhinorrhea and sinus pressure.   Gastrointestinal: Negative for abdominal pain, nausea and vomiting.  Neurological: Negative for dizziness, light-headedness and headaches.    Patient Active Problem List   Diagnosis Date Noted  . Diabetes (HCC) 10/29/2013  . BPH (benign prostatic hypertrophy) 08/20/2012  . ALLERGIC RHINITIS 07/21/2008  . KNEE PAIN, RIGHT 07/21/2008  . HYPERTENSION, BENIGN ESSENTIAL 09/11/1978    Current Outpatient Prescriptions on File Prior to Visit  Medication Sig Dispense Refill  . aspirin 81 MG tablet Take 81 mg by mouth daily.    . fish oil-omega-3 fatty acids 1000 MG capsule Take 2 g by mouth daily.    Marland Kitchen glucose blood test strip Use as instructed; test blood sugar twice a day. E11.9 60 each 12  . hydrochlorothiazide (HYDRODIURIL) 25 MG tablet Take 1 tablet (25 mg total) by mouth daily. 30 tablet 5  . metFORMIN (GLUCOPHAGE) 1000 MG tablet Take 1 tablet (1,000 mg total) by mouth 2 (two) times daily with a meal. 60 tablet 3  . OVER THE COUNTER MEDICATION Vitamin D 400 iu daily    . verapamil (COVERA HS) 240 MG (CO) 24 hr tablet Take 1 tablet (240 mg total) by mouth at bedtime. 30 tablet 5  . canagliflozin (INVOKANA) 100 MG TABS tablet Take 1 tablet (100 mg total) by mouth daily. (Patient not taking: Reported on 03/28/2017) 30 tablet 2  . desloratadine  (CLARINEX REDITAB) 5 MG disintegrating tablet Take 1 tablet by mouth once a day. (Patient not taking: Reported on 03/28/2017) 30 tablet 5   No current facility-administered medications on file prior to visit.     No Known Allergies   Objective:  BP (!) 151/104 (BP Location: Right Arm, Patient Position: Sitting, Cuff Size: Large)   Pulse 77   Temp 98.1 F (36.7 C) (Oral)   Resp 18   Ht 5' 8.11" (1.73 m)   Wt 189 lb 3.2 oz (85.8 kg)   SpO2 97%   BMI 28.67 kg/m   Visual Acuity Screening   Right eye Left eye Both eyes  Without correction:     With correction: 20/40 20/30 20/25     Physical Exam  Constitutional: He is oriented to person, place, and time and well-developed, well-nourished, and in no distress.  HENT:  Head: Normocephalic and atraumatic.  Eyes: Right eye visual fields normal. Pupils are equal, round, and reactive to light. EOM are normal. Right eye exhibits discharge (watery). Right eye exhibits no hordeolum. No foreign body present in the right eye. Right conjunctiva is injected (moderately). Left conjunctiva is not injected.  Fundoscopic exam:      The right eye shows no AV nicking and no papilledema. The right eye shows red reflex.       The left eye shows no AV nicking and no papilledema. The left eye shows red reflex.  Slit lamp exam:  The right eye shows fluorescein uptake (at 1 o clock and 7 o clock of conjunctiva, no uptake over cornea). The right eye shows no corneal abrasion.  No pain with EOMs, penlight examination, or palpation of orbits bilaterally.   Neck: Normal range of motion.  Pulmonary/Chest: Effort normal.  Neurological: He is alert and oriented to person, place, and time. Gait normal.  Skin: Skin is warm and dry.  Psychiatric: Affect normal.  Vitals reviewed.   Assessment and Plan :   1. Bacterial conjunctivitis of right eye No red flags noted. Will treat with topical antibiotic. Pt instructed to return to clinic if symptoms worsen, do not  improve in 2 days,  or as needed - trimethoprim-polymyxin b (POLYTRIM) ophthalmic solution; Place 1 drop into the right eye every 6 (six) hours.  Dispense: 10 mL; Refill: 0  Benjiman CoreBrittany Arrayah Connors, PA-C  [facility] 03/28/2017 9:40 AM

## 2018-11-09 ENCOUNTER — Encounter: Payer: Self-pay | Admitting: Gastroenterology

## 2019-06-18 ENCOUNTER — Telehealth (INDEPENDENT_AMBULATORY_CARE_PROVIDER_SITE_OTHER): Payer: BC Managed Care – PPO | Admitting: Adult Health Nurse Practitioner

## 2019-06-18 ENCOUNTER — Encounter: Payer: Self-pay | Admitting: Adult Health Nurse Practitioner

## 2019-06-18 VITALS — Ht 68.0 in | Wt 180.0 lb

## 2019-06-18 DIAGNOSIS — R432 Parageusia: Secondary | ICD-10-CM | POA: Diagnosis not present

## 2019-06-18 DIAGNOSIS — T732XXA Exhaustion due to exposure, initial encounter: Secondary | ICD-10-CM | POA: Diagnosis not present

## 2019-06-18 DIAGNOSIS — R05 Cough: Secondary | ICD-10-CM | POA: Diagnosis not present

## 2019-06-18 DIAGNOSIS — R059 Cough, unspecified: Secondary | ICD-10-CM

## 2019-06-18 NOTE — Progress Notes (Signed)
Patient Name:  Jack Brady  Date of Birth:    [birth year] MRN:                 [id number] Appointment:    5:20 PM EDT  Chief Complaint: Respiratory illness  HPI:  Patient is a 63 y.o. yo male who presents with 3-4days of symptoms as outlined below in ROS. He has a loss of taste, fatigue, headaches, and fever with chills  noted today.  Some coughing and SOB.    Past Medical History:  Diagnosis Date  . Hypertension     Social History   Tobacco Use  . Smoking status: Former Smoker    Packs/day: 0.50    Years: 20.00    Pack years: 10.00    Types: Cigarettes  . Smokeless tobacco: Never Used  Substance Use Topics  . Alcohol use: Yes    Alcohol/week: 0.0 standard drinks    Comment: 4 Drinks  . Drug use: No    Current Meds  Medication Sig  . aspirin 81 MG tablet Take 81 mg by mouth daily.  . fish oil-omega-3 fatty acids 1000 MG capsule Take 2 g by mouth daily.  Marland Kitchen glucose blood test strip Use as instructed; test blood sugar twice a day. E11.9  . hydrochlorothiazide (HYDRODIURIL) 25 MG tablet Take 1 tablet (25 mg total) by mouth daily.  . metFORMIN (GLUCOPHAGE) 1000 MG tablet Take 1 tablet (1,000 mg total) by mouth 2 (two) times daily with a meal.  . OVER THE COUNTER MEDICATION Vitamin D 400 iu daily  . verapamil (COVERA HS) 240 MG (CO) 24 hr tablet Take 1 tablet (240 mg total) by mouth at bedtime.     No Known Allergies  Review of Systems  Constitutional: Positive for chills, fever and malaise/fatigue.  HENT: Positive for congestion.   Eyes: Negative.   Respiratory: Positive for cough and shortness of breath.   Musculoskeletal: Positive for myalgias.  Neurological: Positive for headaches. Negative for weakness.  Endo/Heme/Allergies: Negative.     Physical Exam   General:  Alert and oriented x 3.  NAD,  Psych:  Conversational, calm, linear thought process.   Ht 5\' 8"  (1.727 m)   Wt 180 lb (81.6 kg)   BMI 27.37 kg/m   Assessment:     Patient Active Problem  List   Diagnosis Date Noted  . Fatigue due to exposure 06/18/2019  . Loss of taste 06/18/2019  . Cough 06/18/2019  . Diabetes ([place]) 10/29/2013  . BPH (benign prostatic hypertrophy) 08/20/2012  . ALLERGIC RHINITIS 07/21/2008  . KNEE PAIN, RIGHT 07/21/2008  . HYPERTENSION, BENIGN ESSENTIAL 09/11/1978      Plan:     Problem list reviewed and updated.  COVID-19 Evaluation:  Orders Placed This Encounter  Procedures  . Novel Coronavirus, NAA ([facility])    Order Specific Question:   Is this test for diagnosis or screening    Answer:   Diagnosis of ill patient    Order Specific Question:   Symptomatic for COVID-19 as defined by CDC    Answer:   Yes    Order Specific Question:   Date of Symptom Onset    Answer:   06/14/2019    Order Specific Question:   Hospitalized for COVID-19    Answer:   No    Order Specific Question:   Admitted to ICU for COVID-19    Answer:   No    Order Specific Question:   Previously tested for COVID-19    Answer:  No    Order Specific Question:   Resident in a congregate (group) care setting    Answer:   No    Order Specific Question:   Is the patient student?    Answer:   No    Order Specific Question:   Employed in healthcare setting    Answer:   No      Assessment and Plan.   Patient has symptoms of Covid.  I have placed an order for him to be tested.  Recommended isolation and wearing masks until test returns.  He verbalized understanding.  Instructions given to [facility].   Elyse Jarvis, NP  4:48 PM

## 2019-06-18 NOTE — Progress Notes (Signed)
HA, cough and body aches. Going on  4 days.

## 2019-06-19 ENCOUNTER — Other Ambulatory Visit: Payer: Self-pay

## 2019-06-19 DIAGNOSIS — Z20822 Contact with and (suspected) exposure to covid-19: Secondary | ICD-10-CM

## 2019-06-20 LAB — NOVEL CORONAVIRUS, NAA: SARS-CoV-2, NAA: DETECTED — AB

## 2019-07-08 ENCOUNTER — Telehealth: Payer: Self-pay

## 2019-07-08 ENCOUNTER — Telehealth: Payer: Self-pay | Admitting: Adult Health Nurse Practitioner

## 2019-07-08 NOTE — Telephone Encounter (Signed)
Pt needs return to work note  From testing positive in covid 19  With copy of results

## 2019-07-08 NOTE — Telephone Encounter (Signed)
Pt. Called asking for a note to return to work. Instructed to call his PCP for a return to work. Verbalizes understanding.

## 2019-07-16 NOTE — Telephone Encounter (Signed)
LVM to call office back about work note.

## 2019-07-16 NOTE — Progress Notes (Signed)
    Telemedicine Encounter- SOAP NOTE Established Patient  This telephone encounter was conducted with the patient's (or proxy's) verbal consent via audio telecommunications: yes/no: Yes Patient was instructed to have this encounter in a suitably private space; and to only have persons present to whom they give permission to participate. In addition, patient identity was confirmed by use of name plus two identifiers (DOB and address).  I discussed the limitations, risks, security and privacy concerns of performing an evaluation and management service by telephone and the availability of in person appointments. I also discussed with the patient that there may be a patient responsible charge related to this service. The patient expressed understanding and agreed to proceed.  I spent 15 minutes   I discussed the assessment and treatment plan with the patient. The patient was provided an opportunity to ask questions and all were answered. The patient agreed with the plan and demonstrated an understanding of the instructions.   The patient was advised to call back or seek an in-person evaluation if the symptoms worsen or if the condition fails to improve as anticipated.  I provided 15 minutes of non-face-to-face time during this encounter.  Glyn Ade, NP  Primary Care at [facility]

## 2020-12-24 ENCOUNTER — Telehealth: Payer: BC Managed Care – PPO | Admitting: Family

## 2021-01-03 NOTE — Progress Notes (Signed)
Virtual Visit via Telephone Note  I connected with Jack Brady, on 01/04/2021 at 5:15 PM by telephone due to the COVID-19 pandemic and verified that I am speaking with the correct person using two identifiers.  Due to current restrictions/limitations of in-office visits due to the COVID-19 pandemic, this scheduled clinical appointment was converted to a telehealth visit.   Consent: I discussed the limitations, risks, security and privacy concerns of performing an evaluation and management service by telephone and the availability of in person appointments. I also discussed with the patient that there may be a patient responsible charge related to this service. The patient expressed understanding and agreed to proceed.   Location of Patient: Home  Location of Provider: [facility]  Persons participating in Telemedicine visit: Jack Brady Jack Stabs, NP Jack Brady, CMA  History of Present Illness: Jack Brady is a 65 year-old male who presents to establish care. PMH significant for essential hypertension, allergic rhinitis, diabetes, benign prostatic hypertrophy, and right knee pain.   Current issues and/or concerns: Reports receiving care through [facility]. Reports he wanted to establish with primary care as well.    Past Medical History:  Diagnosis Date  . Hypertension    No Known Allergies  Current Outpatient Medications on File Prior to Visit  Medication Sig Dispense Refill  . ammonium lactate (LAC-HYDRIN) 12 % lotion APPLY MODERATE AMOUNT TOPICALLY ONCE EVERY DAY FOR DRY SKIN    . aspirin 81 MG EC tablet Take 1 tablet by mouth daily.    Marland Kitchen atorvastatin (LIPITOR) 40 MG tablet Take by mouth.    . fish oil-omega-3 fatty acids 1000 MG capsule Take 2 g by mouth daily.    Marland Kitchen glipiZIDE (GLUCOTROL) 10 MG tablet Take 10 mg by mouth 2 (two) times daily.    Marland Kitchen glucose blood test strip Use as instructed; test blood sugar twice a day.  E11.9 60 each 12  . hydrochlorothiazide (HYDRODIURIL) 25 MG tablet Take 1 tablet (25 mg total) by mouth daily. 30 tablet 5  . insulin glargine (LANTUS) 100 UNIT/ML Solostar Pen INJECT 35 UNITS UNDER SKIN AT BEDTIME FOR DIABETES.DISCARD PEN 28 DAYS AFTER OPENING    . metFORMIN (GLUCOPHAGE) 1000 MG tablet Take 1 tablet (1,000 mg total) by mouth 2 (two) times daily with a meal. 60 tablet 3  . Multiple Vitamin (MULTIVITAMINS PO) Take 1 tablet by mouth daily.    . Omega-3 Fatty Acids (FISH OIL) 1000 MG CAPS Take 1 capsule by mouth 2 (two) times daily.    Marland Kitchen OVER THE COUNTER MEDICATION Vitamin D 400 iu daily    . tamsulosin (FLOMAX) 0.4 MG CAPS capsule Take 0.4 mg by mouth daily.    . verapamil (COVERA HS) 240 MG (CO) 24 hr tablet Take 1 tablet (240 mg total) by mouth at bedtime. 30 tablet 5   No current facility-administered medications on file prior to visit.    Observations/Objective: Alert and oriented x 3. Not in acute distress. Physical examination not completed as this is a telemedicine visit.  Assessment and Plan: 1. Encounter to establish care: - Patient presents today to establish care.  - Patient reports receiving healthcare with [facility]    Follow Up Instructions: Follow-up with primary provider as scheduled.    Patient was given clear instructions to go to Emergency Department or return to medical center if symptoms don't improve, worsen, or new problems develop.The patient verbalized understanding.  I discussed the assessment and treatment plan with the  patient. The patient was provided an opportunity to ask questions and all were answered. The patient agreed with the plan and demonstrated an understanding of the instructions.   The patient was advised to call back or seek an in-person evaluation if the symptoms worsen or if the condition fails to improve as anticipated.   I provided 10 minutes total of non-face-to-face time during this encounter including median  intraservice time, reviewing previous notes, labs, imaging, medications, management and patient verbalized understanding.    Rema Fendt, NP  [facility] [place], [place] [phone] 01/04/2021, 5:15 PM

## 2021-01-04 ENCOUNTER — Telehealth (INDEPENDENT_AMBULATORY_CARE_PROVIDER_SITE_OTHER): Payer: BC Managed Care – PPO | Admitting: Family

## 2021-01-04 ENCOUNTER — Other Ambulatory Visit: Payer: Self-pay

## 2021-01-04 DIAGNOSIS — Z7689 Persons encountering health services in other specified circumstances: Secondary | ICD-10-CM

## 2021-01-04 DIAGNOSIS — E1165 Type 2 diabetes mellitus with hyperglycemia: Secondary | ICD-10-CM | POA: Insufficient documentation

## 2021-01-04 NOTE — Progress Notes (Signed)
Establish care

## 2022-10-26 ENCOUNTER — Ambulatory Visit: Payer: Self-pay

## 2022-10-26 NOTE — Telephone Encounter (Signed)
Unable to reach patient after 2 attempts by [facility] NT, routing to the provider for resolution.    Summary: shoulder pain advice   Pt has right shoulder pain and thinks it maybe arthritis / he asked what he can take for relief / please advise

## 2022-10-26 NOTE — Telephone Encounter (Signed)
Patient called on home number, left VM to return the call to the office to speak to a nurse. Tried cell number and recording call could not be completed at this time, try call again later.   Summary: shoulder pain advice   Pt has right shoulder pain and thinks it maybe arthritis / he asked what he can take for relief / please advise

## 2022-10-26 NOTE — Telephone Encounter (Signed)
Patient called, left VM to the office to speak to a nurse.   Summary: shoulder pain advice   Pt has right shoulder pain and thinks it maybe arthritis / he asked what he can take for relief / please advise

## 2022-10-30 NOTE — Telephone Encounter (Signed)
Appt scheduled 02/21

## 2022-10-30 NOTE — Progress Notes (Deleted)
Patient ID: Jack Brady, male    DOB: [birth year]  MRN: [id number]  CC: Right Shoulder Pain  Subjective: Jack Brady is a 67 y.o. male who presents for right shoulder pain.   His concerns today include:  10/26/2022 per triage RN note:     Summary: shoulder pain advice     Pt has right shoulder pain and thinks it maybe arthritis / he asked what he can take for relief / please advise     Today's visit 11/01/2022:   Patient Active Problem List   Diagnosis Date Noted   Type 2 diabetes mellitus with hyperglycemia ([place]) 01/04/2021   Fatigue due to exposure 06/18/2019   Loss of taste 06/18/2019   Cough 06/18/2019   Diabetes ([place]) 10/29/2013   BPH (benign prostatic hypertrophy) 08/20/2012   ALLERGIC RHINITIS 07/21/2008   KNEE PAIN, RIGHT 07/21/2008   Essential hypertension 09/11/1978     Current Outpatient Medications on File Prior to Visit  Medication Sig Dispense Refill   ammonium lactate (LAC-HYDRIN) 12 % lotion APPLY MODERATE AMOUNT TOPICALLY ONCE EVERY DAY FOR DRY SKIN     aspirin 81 MG EC tablet Take 1 tablet by mouth daily.     atorvastatin (LIPITOR) 40 MG tablet Take by mouth.     fish oil-omega-3 fatty acids 1000 MG capsule Take 2 g by mouth daily.     glipiZIDE (GLUCOTROL) 10 MG tablet Take 10 mg by mouth 2 (two) times daily.     glucose blood test strip Use as instructed; test blood sugar twice a day. E11.9 60 each 12   hydrochlorothiazide (HYDRODIURIL) 25 MG tablet Take 1 tablet (25 mg total) by mouth daily. 30 tablet 5   insulin glargine (LANTUS) 100 UNIT/ML Solostar Pen INJECT 35 UNITS UNDER SKIN AT BEDTIME FOR DIABETES.DISCARD PEN 28 DAYS AFTER OPENING     metFORMIN (GLUCOPHAGE) 1000 MG tablet Take 1 tablet (1,000 mg total) by mouth 2 (two) times daily with a meal. 60 tablet 3   Multiple Vitamin (MULTIVITAMINS PO) Take 1 tablet by mouth daily.     Omega-3 Fatty Acids (FISH OIL) 1000 MG CAPS Take 1 capsule by mouth 2 (two) times daily.     OVER THE COUNTER  MEDICATION Vitamin D 400 iu daily     tamsulosin (FLOMAX) 0.4 MG CAPS capsule Take 0.4 mg by mouth daily.     verapamil (COVERA HS) 240 MG (CO) 24 hr tablet Take 1 tablet (240 mg total) by mouth at bedtime. 30 tablet 5   No current facility-administered medications on file prior to visit.    No Known Allergies  Social History   Socioeconomic History   Marital status: Married    Spouse name: Not on file   Number of children: Not on file   Years of education: Not on file   Highest education level: Not on file  Occupational History   Occupation: public service  Tobacco Use   Smoking status: Former    Packs/day: 0.50    Years: 20.00    Total pack years: 10.00    Types: Cigarettes   Smokeless tobacco: Never  Substance and Sexual Activity   Alcohol use: Yes    Alcohol/week: 0.0 standard drinks of alcohol    Comment: 4 Drinks   Drug use: No   Sexual activity: Yes    Partners: Female  Other Topics Concern   Not on file  Social History Narrative   Not on file   Social Determinants of Health  Financial Resource Strain: Not on file  Food Insecurity: Not on file  Transportation Needs: Not on file  Physical Activity: Not on file  Stress: Not on file  Social Connections: Not on file  Intimate Partner Violence: Not on file    Family History  Problem Relation Age of Onset   Stroke Mother    Hypertension Sister    Heart disease Brother        died from heart attack   Diabetes Maternal Grandmother    Diabetes Brother        died from diabetic coma    No past surgical history on file.  ROS: Review of Systems Negative except as stated above  PHYSICAL EXAM: There were no vitals taken for this visit.  Physical Exam  {male adult master:310786} {male adult master:310785}     Latest Ref Rng & Units 02/09/2015   11:22 AM 08/25/2014    2:26 PM 10/29/2013    1:02 PM  CMP  Glucose 70 - 99 mg/dL 174  139  287   BUN 6 - 23 mg/dL 13  11  13   $ Creatinine 0.50 - 1.35  mg/dL 0.88  0.96  0.95   Sodium 135 - 145 mEq/L 136  136  134   Potassium 3.5 - 5.3 mEq/L 4.2  4.1  4.3   Chloride 96 - 112 mEq/L 99  98  97   CO2 19 - 32 mEq/L 24  28  28   $ Calcium 8.4 - 10.5 mg/dL 9.3  9.4  9.4   Total Protein 6.0 - 8.3 g/dL 7.3  7.0  7.1   Total Bilirubin 0.2 - 1.2 mg/dL 0.6  0.5  0.6   Alkaline Phos 39 - 117 U/L 61  55  72   AST 0 - 37 U/L 23  23  18   $ ALT 0 - 53 U/L 26  19  21    $ Lipid Panel     Component Value Date/Time   CHOL 245 (H) 10/29/2013 1302   TRIG 138 10/29/2013 1302   HDL 56 10/29/2013 1302   CHOLHDL 4.4 10/29/2013 1302   VLDL 28 10/29/2013 1302   LDLCALC 161 (H) 10/29/2013 1302    CBC    Component Value Date/Time   WBC 6.2 08/25/2014 1445   RBC 5.09 08/25/2014 1445   HGB 15.7 08/25/2014 1445   HCT 44.1 08/25/2014 1445   PLT 337 08/25/2014 1445   MCV 86.6 08/25/2014 1445   MCH 30.8 08/25/2014 1445   MCHC 35.6 08/25/2014 1445   RDW 13.6 08/25/2014 1445   LYMPHSABS 3.3 08/25/2014 1445   MONOABS 0.5 08/25/2014 1445   EOSABS 0.1 08/25/2014 1445   BASOSABS 0.1 08/25/2014 1445    ASSESSMENT AND PLAN:  There are no diagnoses linked to this encounter.   Patient was given the opportunity to ask questions.  Patient verbalized understanding of the plan and was able to repeat key elements of the plan. Patient was given clear instructions to go to Emergency Department or return to medical center if symptoms don't improve, worsen, or new problems develop.The patient verbalized understanding.   No orders of the defined types were placed in this encounter.    Requested Prescriptions    No prescriptions requested or ordered in this encounter    No follow-ups on file.  Camillia Herter, NP

## 2022-11-01 ENCOUNTER — Ambulatory Visit: Payer: Self-pay | Admitting: Family
